# Patient Record
Sex: Female | Born: 1985 | Race: White | Hispanic: No | Marital: Single | Smoking: Former smoker
Health system: Southern US, Community
[De-identification: ages and names within clinical notes are randomized; demographics above are authoritative.]

## PROBLEM LIST (undated history)

## (undated) DIAGNOSIS — F32A Depression, unspecified: Secondary | ICD-10-CM

## (undated) DIAGNOSIS — M545 Low back pain, unspecified: Secondary | ICD-10-CM

## (undated) DIAGNOSIS — K219 Gastro-esophageal reflux disease without esophagitis: Secondary | ICD-10-CM

## (undated) DIAGNOSIS — D649 Anemia, unspecified: Secondary | ICD-10-CM

## (undated) DIAGNOSIS — G8929 Other chronic pain: Secondary | ICD-10-CM

## (undated) DIAGNOSIS — F419 Anxiety disorder, unspecified: Secondary | ICD-10-CM

## (undated) HISTORY — PX: TONSILLECTOMY: SUR1361

## (undated) HISTORY — PX: TUBAL LIGATION: SHX77

---
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
## 2005-12-22 ENCOUNTER — Ambulatory Visit: Payer: Self-pay | Admitting: Otolaryngology

## 2006-08-19 ENCOUNTER — Ambulatory Visit: Payer: Self-pay | Admitting: Unknown Physician Specialty

## 2009-02-11 ENCOUNTER — Emergency Department: Payer: Self-pay | Admitting: Emergency Medicine

## 2009-02-18 ENCOUNTER — Ambulatory Visit: Payer: Self-pay | Admitting: Unknown Physician Specialty

## 2009-09-09 ENCOUNTER — Observation Stay: Payer: Self-pay

## 2009-10-29 ENCOUNTER — Observation Stay: Payer: Self-pay

## 2009-11-07 ENCOUNTER — Inpatient Hospital Stay: Payer: Self-pay | Admitting: Obstetrics & Gynecology

## 2010-07-26 HISTORY — PX: DILATION AND CURETTAGE OF UTERUS: SHX78

## 2012-06-09 LAB — HM PAP SMEAR: HM PAP: NEGATIVE

## 2012-11-20 ENCOUNTER — Emergency Department: Payer: Self-pay | Admitting: Emergency Medicine

## 2012-11-20 LAB — CBC
HCT: 38.5 % (ref 35.0–47.0)
HGB: 12.7 g/dL (ref 12.0–16.0)
MCH: 31 pg (ref 26.0–34.0)
MCHC: 32.9 g/dL (ref 32.0–36.0)
MCV: 94 fL (ref 80–100)
Platelet: 183 10*3/uL (ref 150–440)
RBC: 4.08 10*6/uL (ref 3.80–5.20)
RDW: 12.7 % (ref 11.5–14.5)
WBC: 12.1 10*3/uL — ABNORMAL HIGH (ref 3.6–11.0)

## 2012-11-20 LAB — URINALYSIS, COMPLETE
Bilirubin,UR: NEGATIVE
Glucose,UR: NEGATIVE mg/dL (ref 0–75)
Leukocyte Esterase: NEGATIVE
Nitrite: NEGATIVE
Ph: 6 (ref 4.5–8.0)
Protein: NEGATIVE
RBC,UR: 1 /HPF (ref 0–5)
Specific Gravity: 1.003 (ref 1.003–1.030)
Squamous Epithelial: <1
WBC UR: <1 /HPF (ref 0–5)

## 2012-11-20 LAB — WET PREP, GENITAL

## 2012-11-20 LAB — HCG, QUANTITATIVE, PREGNANCY: Beta Hcg, Quant.: 7309 m[IU]/mL — ABNORMAL HIGH

## 2013-02-10 ENCOUNTER — Inpatient Hospital Stay: Payer: Self-pay | Admitting: Psychiatry

## 2013-02-10 LAB — CBC
HCT: 39.6 % (ref 35.0–47.0)
HGB: 13.2 g/dL (ref 12.0–16.0)
MCH: 31.2 pg (ref 26.0–34.0)
MCHC: 33.3 g/dL (ref 32.0–36.0)
MCV: 94 fL (ref 80–100)
Platelet: 189 10*3/uL (ref 150–440)
RBC: 4.23 10*6/uL (ref 3.80–5.20)
RDW: 13.7 % (ref 11.5–14.5)
WBC: 8.2 10*3/uL (ref 3.6–11.0)

## 2013-02-10 LAB — DRUG SCREEN, URINE

## 2013-02-10 LAB — COMPREHENSIVE METABOLIC PANEL
Albumin: 4 g/dL (ref 3.4–5.0)
Alkaline Phosphatase: 73 U/L (ref 50–136)
Anion Gap: 7 (ref 7–16)
BUN: 7 mg/dL (ref 7–18)
Bilirubin,Total: 0.3 mg/dL (ref 0.2–1.0)
Calcium, Total: 8.5 mg/dL (ref 8.5–10.1)
Chloride: 107 mmol/L (ref 98–107)
Co2: 24 mmol/L (ref 21–32)
Creatinine: 0.69 mg/dL (ref 0.60–1.30)
EGFR (African American): >60
EGFR (Non-African Amer.): >60
Glucose: 105 mg/dL — ABNORMAL HIGH (ref 65–99)
Osmolality: 274 (ref 275–301)
Potassium: 3.4 mmol/L — ABNORMAL LOW (ref 3.5–5.1)
SGOT(AST): 17 U/L (ref 15–37)
SGPT (ALT): 16 U/L (ref 12–78)
Sodium: 138 mmol/L (ref 136–145)
Total Protein: 7.9 g/dL (ref 6.4–8.2)

## 2013-02-10 LAB — URINALYSIS, COMPLETE
Bacteria: NONE SEEN
Bilirubin,UR: NEGATIVE
Blood: NEGATIVE
Glucose,UR: NEGATIVE mg/dL (ref 0–75)
Ketone: NEGATIVE
Leukocyte Esterase: NEGATIVE
Nitrite: NEGATIVE
Ph: 6 (ref 4.5–8.0)
Protein: NEGATIVE
RBC,UR: NONE SEEN /HPF (ref 0–5)
Specific Gravity: 1.002 (ref 1.003–1.030)
Squamous Epithelial: NONE SEEN
WBC UR: <1 /HPF (ref 0–5)

## 2013-02-10 LAB — ETHANOL
Ethanol %: 0.209 % — ABNORMAL HIGH (ref 0.000–0.080)
Ethanol: 209 mg/dL

## 2013-02-10 LAB — TSH: Thyroid Stimulating Horm: 2.87 u[IU]/mL

## 2013-02-11 LAB — BEHAVIORAL MEDICINE 1 PANEL
Albumin: 3.7 g/dL (ref 3.4–5.0)
Basophil %: 1.3 %
Bilirubin,Total: 1 mg/dL (ref 0.2–1.0)
Chloride: 106 mmol/L (ref 98–107)
Creatinine: 0.66 mg/dL (ref 0.60–1.30)
EGFR (African American): >60
Eosinophil #: 0.1 10*3/uL (ref 0.0–0.7)
Glucose: 84 mg/dL (ref 65–99)
HCT: 37 % (ref 35.0–47.0)
Lymphocyte #: 2.2 10*3/uL (ref 1.0–3.6)
Lymphocyte %: 26.2 %
MCHC: 33.6 g/dL (ref 32.0–36.0)
Monocyte %: 13.8 %
Neutrophil #: 4.9 10*3/uL (ref 1.4–6.5)
Potassium: 3.6 mmol/L (ref 3.5–5.1)
RDW: 13.8 % (ref 11.5–14.5)
Sodium: 139 mmol/L (ref 136–145)
Thyroid Stimulating Horm: 0.97 u[IU]/mL
Total Protein: 7.1 g/dL (ref 6.4–8.2)
WBC: 8.6 10*3/uL (ref 3.6–11.0)

## 2013-02-11 LAB — URINALYSIS, COMPLETE
Leukocyte Esterase: NEGATIVE
Ph: 7 (ref 4.5–8.0)
Protein: NEGATIVE
RBC,UR: <1 /HPF (ref 0–5)
Specific Gravity: 1.015 (ref 1.003–1.030)

## 2013-11-18 ENCOUNTER — Observation Stay: Payer: Self-pay | Admitting: Obstetrics and Gynecology

## 2013-11-18 LAB — URINALYSIS, COMPLETE
BLOOD: NEGATIVE
Bilirubin,UR: NEGATIVE
Glucose,UR: NEGATIVE mg/dL (ref 0–75)
KETONE: NEGATIVE
Leukocyte Esterase: NEGATIVE
Nitrite: NEGATIVE
PH: 6 (ref 4.5–8.0)
PROTEIN: NEGATIVE
RBC,UR: NONE SEEN /HPF (ref 0–5)
Specific Gravity: 1.015 (ref 1.003–1.030)
WBC UR: 1 /HPF (ref 0–5)

## 2013-11-18 LAB — CBC WITH DIFFERENTIAL/PLATELET
BASOS ABS: 0.1 10*3/uL (ref 0.0–0.1)
Basophil %: 0.8 %
EOS ABS: 0.2 10*3/uL (ref 0.0–0.7)
Eosinophil %: 1.2 %
HCT: 31.8 % — ABNORMAL LOW (ref 35.0–47.0)
HGB: 10.6 g/dL — AB (ref 12.0–16.0)
LYMPHS PCT: 14.9 %
Lymphocyte #: 2.1 10*3/uL (ref 1.0–3.6)
MCH: 30.4 pg (ref 26.0–34.0)
MCHC: 33.3 g/dL (ref 32.0–36.0)
MCV: 91 fL (ref 80–100)
MONOS PCT: 8.8 %
Monocyte #: 1.2 x10 3/mm — ABNORMAL HIGH (ref 0.2–0.9)
Neutrophil #: 10.3 10*3/uL — ABNORMAL HIGH (ref 1.4–6.5)
Neutrophil %: 74.3 %
PLATELETS: 234 10*3/uL (ref 150–440)
RBC: 3.48 10*6/uL — ABNORMAL LOW (ref 3.80–5.20)
RDW: 13.2 % (ref 11.5–14.5)
WBC: 13.9 10*3/uL — ABNORMAL HIGH (ref 3.6–11.0)

## 2013-11-18 LAB — COMPREHENSIVE METABOLIC PANEL
ALBUMIN: 2.5 g/dL — AB (ref 3.4–5.0)
ANION GAP: 7 (ref 7–16)
Alkaline Phosphatase: 97 U/L
BUN: 8 mg/dL (ref 7–18)
Bilirubin,Total: 0.1 mg/dL — ABNORMAL LOW (ref 0.2–1.0)
CALCIUM: 8.5 mg/dL (ref 8.5–10.1)
CO2: 21 mmol/L (ref 21–32)
CREATININE: 0.55 mg/dL — AB (ref 0.60–1.30)
Chloride: 107 mmol/L (ref 98–107)
EGFR (African American): >60
Glucose: 88 mg/dL (ref 65–99)
Osmolality: 268 (ref 275–301)
POTASSIUM: 3.5 mmol/L (ref 3.5–5.1)
SGOT(AST): 15 U/L (ref 15–37)
SGPT (ALT): 11 U/L — ABNORMAL LOW (ref 12–78)
SODIUM: 135 mmol/L — AB (ref 136–145)
Total Protein: 6.4 g/dL (ref 6.4–8.2)

## 2014-01-03 ENCOUNTER — Ambulatory Visit: Payer: Self-pay | Admitting: Obstetrics & Gynecology

## 2014-01-03 LAB — CBC WITH DIFFERENTIAL/PLATELET
BASOS ABS: 0.1 10*3/uL (ref 0.0–0.1)
BASOS PCT: 0.6 %
EOS PCT: 0.5 %
Eosinophil #: 0.1 10*3/uL (ref 0.0–0.7)
HCT: 32.9 % — AB (ref 35.0–47.0)
HGB: 11 g/dL — ABNORMAL LOW (ref 12.0–16.0)
Lymphocyte #: 1.7 10*3/uL (ref 1.0–3.6)
Lymphocyte %: 13.6 %
MCH: 30.5 pg (ref 26.0–34.0)
MCHC: 33.5 g/dL (ref 32.0–36.0)
MCV: 91 fL (ref 80–100)
Monocyte #: 1.1 x10 3/mm — ABNORMAL HIGH (ref 0.2–0.9)
Monocyte %: 9.1 %
Neutrophil #: 9.5 10*3/uL — ABNORMAL HIGH (ref 1.4–6.5)
Neutrophil %: 76.2 %
Platelet: 145 10*3/uL — ABNORMAL LOW (ref 150–440)
RBC: 3.61 10*6/uL — AB (ref 3.80–5.20)
RDW: 14.6 % — AB (ref 11.5–14.5)
WBC: 12.5 10*3/uL — ABNORMAL HIGH (ref 3.6–11.0)

## 2014-01-04 ENCOUNTER — Inpatient Hospital Stay: Payer: Self-pay | Admitting: Obstetrics and Gynecology

## 2014-01-05 LAB — CBC WITH DIFFERENTIAL/PLATELET
BASOS ABS: 0.1 10*3/uL (ref 0.0–0.1)
BASOS PCT: 0.6 %
Eosinophil #: 0.1 10*3/uL (ref 0.0–0.7)
Eosinophil %: 0.7 %
HCT: 30.3 % — ABNORMAL LOW (ref 35.0–47.0)
HGB: 10.2 g/dL — AB (ref 12.0–16.0)
LYMPHS ABS: 2 10*3/uL (ref 1.0–3.6)
LYMPHS PCT: 12.1 %
MCH: 30.8 pg (ref 26.0–34.0)
MCHC: 33.8 g/dL (ref 32.0–36.0)
MCV: 91 fL (ref 80–100)
Monocyte #: 1.8 x10 3/mm — ABNORMAL HIGH (ref 0.2–0.9)
Monocyte %: 10.8 %
NEUTROS ABS: 12.6 10*3/uL — AB (ref 1.4–6.5)
NEUTROS PCT: 75.8 %
Platelet: 136 10*3/uL — ABNORMAL LOW (ref 150–440)
RBC: 3.32 10*6/uL — ABNORMAL LOW (ref 3.80–5.20)
RDW: 14.8 % — AB (ref 11.5–14.5)
WBC: 16.6 10*3/uL — ABNORMAL HIGH (ref 3.6–11.0)
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
## 2015-02-16 NOTE — Op Note (Signed)
PATIENT NAME:  Denise Houston, [name] MR#:  [id number] DATE OF BIRTH:  [birth year]  DATE OF PROCEDURE:  01/04/2014  PREOPERATIVE DIAGNOSIS: Term intrauterine pregnancy and prior history of cesarean section.   POSTOPERATIVE DIAGNOSIS: Term intrauterine pregnancy and prior history of cesarean section.  PROCEDURE: Low transverse cesarean section and placement of On-Q pain pump.   SURGEON: Annamarie MajorPaul Madailein Londo, M.D.   ASSISTANT: Dr. Bonney AidStaebler.   ANESTHESIA: Spinal.   ESTIMATED BLOOD LOSS: 250 mL.   COMPLICATIONS: None.   FINDINGS: Normal tubes, ovaries, and uterus. Viable female weighing 8 pounds, 15 ounces with Apgar scores of 9 and 9 at one and five minutes, respectively.   DISPOSITION: To the recovery room in stable condition.   TECHNIQUE: The patient is prepped and draped in the usual sterile fashion after adequate anesthesia is obtained in the supine position on the operating room table. Scalpel is used to create a low transverse skin incision in the area of the prior scar down to the level of the rectus fascia. The rectus fascia was then dissected bilaterally using Mayo scissors. The rectus muscles are separated from the fascia and then separated in the midline. The peritoneum is penetrated and the bladder is inferiorly retracted.   A scalpel was used to create a low transverse hysterotomy incision that is then extended by blunt dissection. Amniotomy then reveals clear fluid. The head is delivered with suctioning of the oropharynx and no nuchal cord is noted. No vacuum device is used. The remaining portion of the infant is delivered and handed to the pediatric team.   Cord blood is obtained and the placenta is manually extracted. The uterus is externalized and cleansed of all membranes and debris using a moist sponge. The hysterotomy incision is closed with a running #1 Vicryl suture in a locking fashion followed by a second layer to imbricate the first layer with excellent hemostasis noted. The uterus is  placed back in the intra-abdominal cavity and the paracolic gutters are irrigated with warm saline. Re-examination of the incision reveals excellent hemostasis and Interceed was placed over the incision to minimize adhesion formation.   The peritoneum is closed with 1 Vicryl suture. The On-Q pain pump catheters are then placed. First, trocars were placed through the abdomen into the subfascial space and then the catheters are threaded into place in the subfascial space. The fascia is then closed with a 0 Maxon suture with careful placement not to incorporate these catheters. Subcutaneous tissues are irrigated and hemostasis is assured using electrocautery. Skin is then closed with 4-0 Vicryl suture in a subcuticular fashion followed by placement of Dermabond. The catheters were flushed with 5 mL each of bupivacaine and then stabilized in place with a Tegaderm bandage. The patient goes to the recovery room in stable condition. All sponge, instrument, and needle counts are correct.   ____________________________ R. Annamarie MajorPaul Davan Nawabi, MD rph:aw D: 01/04/2014 08:35:05 ET T: 01/04/2014 08:43:15 ET JOB#: [id number]  cc: Dierdre Searles. Paul Seniyah Esker, MD, <Dictator> Nadara MustardOBERT P Aeriana Speece MD ELECTRONICALLY SIGNED 01/04/2014 20:19

## 2015-03-05 NOTE — H&P (Signed)
L&D Evaluation:  History Expanded:  HPI 29 yo G4P1021 at 2132w5days, who has had previous csection and presents with ctx and some back pain  desires repeat. late entry addition   Gravida 4   Term 1   PreTerm 0   Abortion 2   Living 1   Blood Type (Maternal) O positive   Group B Strep Results Maternal (Result >5wks must be treated as unknown) unknown/result > 5 weeks ago   Maternal HIV Negative   Maternal Syphilis Ab Nonreactive   Maternal Varicella Immune   Rubella Results (Maternal) immune   EDC 09-Jan-2014   Presents with contractions   Patient's Medical History No Chronic Illness   Patient's Surgical History Previous C-Section   Medications Pre Natal Vitamins   Allergies NKDA   Social History none   Family History Non-Contributory   ROS:  ROS All systems were reviewed.  HEENT, CNS, GI, GU, Respiratory, CV, Renal and Musculoskeletal systems were found to be normal.   Exam:  Vital Signs stable   Urine Protein negative dipstick   General no apparent distress   Mental Status clear   Chest clear   Heart normal sinus rhythm   Abdomen gravid, non-tender   Estimated Fetal Weight Average for gestational age, long and closed   Back no CVAT   Edema no edema   Reflexes 1+   Clonus positive   Pelvic no external lesions   Mebranes Intact   FHT normal rate with no decels, cat 1   Ucx absent   Skin dry   Impression:  Impression preterm contractions   Plan:  Plan monitor contractions and for cervical change   Comments give terb x 1 has stopped her   Follow Up Appointment in 1 week   Electronic Signatures: Adria DevonKlett, Kinslie Hove (MD)  (Signed 27-Jan-15 20:00)  Authored: L&D Evaluation   Last Updated: 27-Jan-15 20:00 by Adria DevonKlett, Nareg Breighner (MD)

## 2016-01-17 ENCOUNTER — Encounter: Payer: Self-pay | Admitting: Obstetrics & Gynecology

## 2016-05-09 ENCOUNTER — Emergency Department: Payer: 59

## 2016-05-09 ENCOUNTER — Emergency Department
Admission: EM | Admit: 2016-05-09 | Discharge: 2016-05-09 | Disposition: A | Discharge status: Home / Self Care | Payer: 59 | Attending: Emergency Medicine | Admitting: Emergency Medicine

## 2016-05-09 ENCOUNTER — Encounter: Payer: Self-pay | Admitting: Emergency Medicine

## 2016-05-09 DIAGNOSIS — S161XXA Strain of muscle, fascia and tendon at neck level, initial encounter: Secondary | ICD-10-CM | POA: Diagnosis not present

## 2016-05-09 DIAGNOSIS — Y999 Unspecified external cause status: Secondary | ICD-10-CM | POA: Diagnosis not present

## 2016-05-09 DIAGNOSIS — Y929 Unspecified place or not applicable: Secondary | ICD-10-CM | POA: Diagnosis not present

## 2016-05-09 DIAGNOSIS — Y939 Activity, unspecified: Secondary | ICD-10-CM | POA: Diagnosis not present

## 2016-05-09 DIAGNOSIS — F17200 Nicotine dependence, unspecified, uncomplicated: Secondary | ICD-10-CM | POA: Insufficient documentation

## 2016-05-09 DIAGNOSIS — M542 Cervicalgia: Secondary | ICD-10-CM | POA: Diagnosis present

## 2016-05-09 DIAGNOSIS — S40211A Abrasion of right shoulder, initial encounter: Secondary | ICD-10-CM | POA: Insufficient documentation

## 2016-05-09 DIAGNOSIS — S300XXA Contusion of lower back and pelvis, initial encounter: Secondary | ICD-10-CM | POA: Insufficient documentation

## 2016-05-09 LAB — POCT PREGNANCY, URINE: PREG TEST UR: NEGATIVE

## 2016-05-09 MED ORDER — HYDROCODONE-ACETAMINOPHEN 5-325 MG PO TABS
1.0000 | ORAL_TABLET | Freq: Once | ORAL | Status: AC
  Administered 2016-05-09: 1 via ORAL
  Filled 2016-05-09: qty 1

## 2016-05-09 MED ORDER — IBUPROFEN 800 MG PO TABS: 800.0000 mg | ORAL_TABLET | Freq: Three times a day (TID) | ORAL | Status: DC | PRN

## 2016-05-09 MED ORDER — CYCLOBENZAPRINE HCL 10 MG PO TABS: 10.0000 mg | ORAL_TABLET | Freq: Three times a day (TID) | ORAL | Status: DC | PRN
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
## 2016-05-09 NOTE — Discharge Instructions (Signed)
Cervical Sprain °A cervical sprain is an injury in the neck in which the strong, fibrous tissues (ligaments) that connect your neck bones stretch or tear. Cervical sprains can range from mild to severe. Severe cervical sprains can cause the neck vertebrae to be unstable. This can lead to damage of the spinal cord and can result in serious nervous system problems. The amount of time it takes for a cervical sprain to get better depends on the cause and extent of the injury. Most cervical sprains heal in 1 to 3 weeks. °CAUSES  °Severe cervical sprains may be caused by:  °· Contact sport injuries (such as from football, rugby, wrestling, hockey, auto racing, gymnastics, diving, martial arts, or boxing).   °· Motor vehicle collisions.   °· Whiplash injuries. This is an injury from a sudden forward and backward whipping movement of the head and neck.  °· Falls.   °Mild cervical sprains may be caused by:  °· Being in an awkward position, such as while cradling a telephone between your ear and shoulder.   °· Sitting in a chair that does not offer proper support.   °· Working at a poorly designed computer station.   °· Looking up or down for long periods of time.   °SYMPTOMS  °· Pain, soreness, stiffness, or a burning sensation in the front, back, or sides of the neck. This discomfort may develop immediately after the injury or slowly, 24 hours or more after the injury.   °· Pain or tenderness directly in the middle of the back of the neck.   °· Shoulder or upper back pain.   °· Limited ability to move the neck.   °· Headache.   °· Dizziness.   °· Weakness, numbness, or tingling in the hands or arms.   °· Muscle spasms.   °· Difficulty swallowing or chewing.   °· Tenderness and swelling of the neck.   °DIAGNOSIS  °Most of the time your health care provider can diagnose a cervical sprain by taking your history and doing a physical exam. Your health care provider will ask about previous neck injuries and any known neck  problems, such as arthritis in the neck. X-rays may be taken to find out if there are any other problems, such as with the bones of the neck. Other tests, such as a CT scan or MRI, may also be needed.  °TREATMENT  °Treatment depends on the severity of the cervical sprain. Mild sprains can be treated with rest, keeping the neck in place (immobilization), and pain medicines. Severe cervical sprains are immediately immobilized. Further treatment is done to help with pain, muscle spasms, and other symptoms and may include: °· Medicines, such as pain relievers, numbing medicines, or muscle relaxants.   °· Physical therapy. This may involve stretching exercises, strengthening exercises, and posture training. Exercises and improved posture can help stabilize the neck, strengthen muscles, and help stop symptoms from returning.   °HOME CARE INSTRUCTIONS  °· Put ice on the injured area.   °¨ Put ice in a plastic bag.   °¨ Place a towel between your skin and the bag.   °¨ Leave the ice on for 15-20 minutes, 3-4 times a day.   °· If your injury was severe, you may have been given a cervical collar to wear. A cervical collar is a two-piece collar designed to keep your neck from moving while it heals. °¨ Do not remove the collar unless instructed by your health care provider. °¨ If you have long hair, keep it outside of the collar. °¨ Ask your health care provider before making any adjustments to your collar. Minor   adjustments may be required over time to improve comfort and reduce pressure on your chin or on the back of your head.  Ifyou are allowed to remove the collar for cleaning or bathing, follow your health care provider's instructions on how to do so safely.  Keep your collar clean by wiping it with mild soap and water and drying it completely. If the collar you have been given includes removable pads, remove them every 1-2 days and hand wash them with soap and water. Allow them to air dry. They should be completely  dry before you wear them in the collar.  If you are allowed to remove the collar for cleaning and bathing, wash and dry the skin of your neck. Check your skin for irritation or sores. If you see any, tell your health care provider.  Do not drive while wearing the collar.   Only take over-the-counter or prescription medicines for pain, discomfort, or fever as directed by your health care provider.   Keep all follow-up appointments as directed by your health care provider.   Keep all physical therapy appointments as directed by your health care provider.   Make any needed adjustments to your workstation to promote good posture.   Avoid positions and activities that make your symptoms worse.   Warm up and stretch before being active to help prevent problems.  SEEK MEDICAL CARE IF:   Your pain is not controlled with medicine.   You are unable to decrease your pain medicine over time as planned.   Your activity level is not improving as expected.  SEEK IMMEDIATE MEDICAL CARE IF:   You develop any bleeding.  You develop stomach upset.  You have signs of an allergic reaction to your medicine.   Your symptoms get worse.   You develop new, unexplained symptoms.   You have numbness, tingling, weakness, or paralysis in any part of your body.  MAKE SURE YOU:   Understand these instructions.  Will watch your condition.  Will get help right away if you are not doing well or get worse.   This information is not intended to replace advice given to you by your health care provider. Make sure you discuss any questions you have with your health care provider.   Document Released: 08/09/2007 Document Revised: 10/17/2013 Document Reviewed: 04/19/2013 Elsevier Interactive Patient Education 2016 [employer]  Tailbone Injury The tailbone (coccyx) is the small bone at the lower end of the spine. A tailbone injury may involve stretched ligaments, bruising, or a broken bone  (fracture). Tailbone injuries can be painful, and some may take a long time to heal. CAUSES This condition is often caused by falling and landing on the tailbone. Other causes include:  Repeated strain or friction from actions such as rowing and bicycling.  Childbirth. In some cases, the cause may not be known. RISK FACTORS This condition is more common in women than in men. SYMPTOMS Symptoms of this condition include:  Pain in the lower back, especially when sitting.  Pain or difficulty when standing up from a sitting position.  Bruising in the tailbone area.  Painful bowel movements.  In women, pain during intercourse. DIAGNOSIS This condition may be diagnosed based on your symptoms and a physical exam. X-rays may be taken if a fracture is suspected. You may also have other tests, such as a CT scan or MRI. TREATMENT This condition may be treated with medicines to help relieve your pain. Most tailbone injuries heal on their own in  4-6 weeks. However, recovery time may be longer if the injury involves a fracture. HOME CARE INSTRUCTIONS  Take medicines only as directed by your health care provider.  If directed, apply ice to the injured area:  Put ice in a plastic bag.  Place a towel between your skin and the bag.  Leave the ice on for 20 minutes, 2-3 times per day for the first 1-2 days.  Sit on a large, rubber or inflated ring or cushion to ease your pain. Lean forward when you are sitting to help decrease discomfort.  Avoid sitting for long periods of time.  Increase your activity as the pain allows. Perform any exercises that are recommended by your health care provider or physical therapist.  If you have pain during bowel movements, use stool softeners as directed by your health care provider.  Eat a diet that includes plenty of fiber to help prevent constipation.  Keep all follow-up visits as directed by your health care provider. This is  important. PREVENTION Wear appropriate padding and sports gear when bicycling and rowing. This can help to prevent developing an injury that is caused by repeated strain or friction. SEEK MEDICAL CARE IF:  Your pain becomes worse.  Your bowel movements cause a great deal of discomfort.  You are unable to have a bowel movement.  You have uncontrolled urine loss (urinary incontinence).  You have a fever.   This information is not intended to replace advice given to you by your health care provider. Make sure you discuss any questions you have with your health care provider.   Document Released: 10/09/2000 Document Revised: 02/26/2015 Document Reviewed: 10/08/2014 Elsevier Interactive Patient Education [employer]
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
## 2017-03-08 ENCOUNTER — Ambulatory Visit: Payer: 59

## 2017-03-08 ENCOUNTER — Encounter (INDEPENDENT_AMBULATORY_CARE_PROVIDER_SITE_OTHER): Payer: 59 | Admitting: Podiatry

## 2017-03-08 DIAGNOSIS — M79674 Pain in right toe(s): Secondary | ICD-10-CM

## 2017-03-08 DIAGNOSIS — M79675 Pain in left toe(s): Secondary | ICD-10-CM

## 2017-03-08 NOTE — Progress Notes (Signed)
This encounter was created in error - please disregard.

## 2017-03-10 ENCOUNTER — Ambulatory Visit (INDEPENDENT_AMBULATORY_CARE_PROVIDER_SITE_OTHER): Payer: 59 | Admitting: Podiatry

## 2017-03-10 ENCOUNTER — Encounter: Payer: Self-pay | Admitting: Podiatry

## 2017-03-10 ENCOUNTER — Ambulatory Visit: Payer: 59

## 2017-03-10 DIAGNOSIS — M79671 Pain in right foot: Secondary | ICD-10-CM

## 2017-03-10 DIAGNOSIS — M21629 Bunionette of unspecified foot: Secondary | ICD-10-CM | POA: Diagnosis not present

## 2017-03-10 DIAGNOSIS — M2041 Other hammer toe(s) (acquired), right foot: Secondary | ICD-10-CM | POA: Diagnosis not present

## 2017-03-10 DIAGNOSIS — M2042 Other hammer toe(s) (acquired), left foot: Secondary | ICD-10-CM | POA: Diagnosis not present

## 2017-03-10 DIAGNOSIS — M79672 Pain in left foot: Principal | ICD-10-CM

## 2017-03-10 NOTE — Progress Notes (Signed)
She presents today with chief complaint of pain and burning to the fourth and fifth digits of the bilateral foot and and not to the metatarsophalangeal joint fifth bilateral. She states that she is [redacted] weeks pregnant would like to have the surgery correct these toes prior to being pregnant. She states that she had surgery to the fourth toe bilaterally many years ago.  Objective: Vital signs are stable alert oriented 3 have reviewed her past medical history medications allergies and social history. Pulses are palpable neurological sensorium is intact deep tendon reflexes are intact muscle strength +5 over 5 dorsiflexion plantar flexors and inverters everters all his musculatures intact. Orthopedic evaluation of his result is disabling for range of motion and crepitus toes bunion deformities are present bilaterally. Varus rotated hammertoe deformity fifth bilateral and a flexible contracted hammertoe deformity with dorsal erythema and reactive hyperkeratosis at the level of the PIPJ.  Assessment: Hammertoe deformities 10 but informed his bilateral fourth and fifth.  Plan: Recommend that she follow-up with us on an as-needed basis once her baby is born for surgical intervention.

## 2017-03-19 ENCOUNTER — Ambulatory Visit (INDEPENDENT_AMBULATORY_CARE_PROVIDER_SITE_OTHER): Payer: 59 | Admitting: Obstetrics & Gynecology

## 2017-03-19 ENCOUNTER — Encounter: Payer: Self-pay | Admitting: Obstetrics & Gynecology

## 2017-03-19 VITALS — BP 110/60 | Wt 152.0 lb

## 2017-03-19 DIAGNOSIS — Z3490 Encounter for supervision of normal pregnancy, unspecified, unspecified trimester: Secondary | ICD-10-CM

## 2017-03-19 DIAGNOSIS — Z98891 History of uterine scar from previous surgery: Secondary | ICD-10-CM

## 2017-03-19 DIAGNOSIS — Z3A09 9 weeks gestation of pregnancy: Secondary | ICD-10-CM | POA: Diagnosis not present

## 2017-03-19 DIAGNOSIS — Z3201 Encounter for pregnancy test, result positive: Secondary | ICD-10-CM

## 2017-03-19 DIAGNOSIS — O0990 Supervision of high risk pregnancy, unspecified, unspecified trimester: Secondary | ICD-10-CM | POA: Insufficient documentation

## 2017-03-19 LAB — POCT URINE PREGNANCY: PREG TEST UR: POSITIVE — AB

## 2017-03-19 MED ORDER — DOXYLAMINE-PYRIDOXINE 10-10 MG PO TBEC: 2.0000 | DELAYED_RELEASE_TABLET | Freq: Every day | ORAL | 5 refills | Status: DC

## 2017-03-19 NOTE — Addendum Note (Signed)
Addended by: Cornelius MorasPATTERSON, Dilara Navarrete D on: 03/19/2017 10:33 AM   Modules accepted: Orders

## 2017-03-19 NOTE — Progress Notes (Signed)
03/19/2017   Chief Complaint: Missed period  Transfer of Care Patient: no  History of Present Illness: [name] is a 31 y.o. G1P0 [redacted]w[redacted]d based on Patient's last menstrual period was 01/12/2017. with an Estimated Date of Delivery: 10/19/17, with the above CC. Preg complicated by has Encounter for supervision of low-risk pregnancy, antepartum and History of cesarean delivery on her problem list..  Her periods were: irregular periods from 28 to 36 days She was using no method when she conceived.  She has Positive signs or symptoms of nausea/vomiting of pregnancy. She has Negative signs or symptoms of miscarriage or preterm labor She identifies Negative Zika risk factors for her and her partner On any different medications around the time she conceived/early pregnancy: No  History of varicella: Yes   ROS: A 12-point review of systems was performed and negative, except as stated in the above HPI.  OBGYN History: As per HPI. OB History  Gravida Para Term Preterm AB Living  1            SAB TAB Ectopic Multiple Live Births               # Outcome Date GA Lbr Len/2nd Weight Sex Delivery Anes PTL Lv  1 Current               Any issues with any prior pregnancies: CS x2 Any prior children are healthy, doing well, without any problems or issues: no History of pap smears: No. Last pap smear 2015. Abnormal: no  History of STIs: No   Past Medical History: No past medical history on file.  Past Surgical History: No past surgical history on file.  Family History:  No family history on file. She denies any female cancers, bleeding or blood clotting disorders.  She denies any history of mental retardation, birth defects or genetic disorders in her or the FOB's history  Social History:  Social History   Social History  . Marital status: Single    Spouse name: N/A  . Number of children: N/A  . Years of education: N/A   Occupational History  . Not on file.   Social History Main  Topics  . Smoking status: Current Every Day Smoker  . Smokeless tobacco: Not on file  . Alcohol use No  . Drug use: Unknown  . Sexual activity: Not on file   Other Topics Concern  . Not on file   Social History Narrative  . No narrative on file   Any pets in the household: no  Allergy: No Known Allergies  Current Outpatient Medications:  Current Outpatient Prescriptions:  .  Prenatal Vit-Fe Fumarate-FA (MULTIVITAMIN-PRENATAL) 27-0.8 MG TABS tablet, Take 1 tablet by mouth daily at 12 noon., Disp: , Rfl:  .  Doxylamine-Pyridoxine (DICLEGIS) 10-10 MG TBEC, Take 2 tablets by mouth at bedtime. If symptoms persist, add one tablet in the morning and one in the afternoon, Disp: 100 tablet, Rfl: 5 .  ibuprofen (ADVIL,MOTRIN) 800 MG tablet, Take 1 tablet (800 mg total) by mouth every 8 (eight) hours as needed. (Patient not taking: Reported on 03/19/2017), Disp: 30 tablet, Rfl: 0   Physical Exam:   BP 110/60   Wt 152 lb (68.9 kg)   LMP 01/12/2017 Comment: neg preg test  BMI 23.81 kg/m  Body mass index is 23.81 kg/m. Constitutional: Well nourished, well developed female in no acute distress.  Neck:  Supple, normal appearance, and no thyromegaly  Cardiovascular: S1, S2 normal, no murmur, rub or gallop,  regular rate and rhythm Respiratory:  Clear to auscultation bilateral. Normal respiratory effort Abdomen: positive bowel sounds and no masses, hernias; diffusely non tender to palpation, non distended Breasts: breasts appear normal, no suspicious masses, no skin or nipple changes or axillary nodes. Neuro/Psych:  Normal mood and affect.  Skin:  Warm and dry.  Lymphatic:  No inguinal lymphadenopathy.   Pelvic exam: is not limited by body habitus EGBUS: within normal limits, Vagina: within normal limits and with no blood in the vault, Cervix: normal appearing cervix without discharge or lesions, closed/long/high, Uterus:  enlarged: 10 weels, and Adnexa:  not evaluated  Assessment: Ms.  Denise Houston is a 31 y.o. G1P0 7114w3d based on Patient's last menstrual period was 01/12/2017. with an Estimated Date of Delivery: 10/19/17,  for prenatal care.  Plan:  No problem-specific Assessment & Plan notes found for this encounter.  1. [redacted] weeks gestation of pregnancy - IGP,CtNgTv,Apt HPV,rfx16/18,45 - Urine culture - Urine Drug Panel 7 - RPR+Rh+ABO+Rub Ab+Ab Scr+CB... - US OB Comp Less 14 Wks; Future  2. Encounter for supervision of low-risk pregnancy, antepartum - IGP,CtNgTv,Apt HPV,rfx16/18,45 - Urine culture - Urine Drug Panel 7 - RPR+Rh+ABO+Rub Ab+Ab Scr+CB... - US OB Comp Less 14 Wks; Future  3. History of cesarean delivery - IGP,CtNgTv,Apt HPV,rfx16/18,45 - Urine culture - Urine Drug Panel 7 - RPR+Rh+ABO+Rub Ab+Ab Scr+CB... - US OB Comp Less 14 Wks; Future   The patient has not traveled to a [place] endemic area within the past 6 months, nor has she had unprotected sex with a partner who has travelled to a [place] endemic region within the past 6 months. The patient has been advised to notify us if these factors change any time during this current pregnancy, so adequate testing and monitoring can be initiated.  Problem list reviewed and updated.  Follow up in 1 weeks. Annamarie MajorPaul Braeden Dolinski, MD, Merlinda FrederickFACOG [facility], [facility] 03/19/2017  10:21 AM

## 2017-03-19 NOTE — Patient Instructions (Signed)
First Trimester of Pregnancy The first trimester of pregnancy is from week 1 until the end of week 13 (months 1 through 3). A week after a sperm fertilizes an egg, the egg will implant on the wall of the uterus. This embryo will begin to develop into a baby. Genes from you and your partner will form the baby. The female genes will determine whether the baby will be a boy or a girl. At 6-8 weeks, the eyes and face will be formed, and the heartbeat can be seen on ultrasound. At the end of 12 weeks, all the baby's organs will be formed. Now that you are pregnant, you will want to do everything you can to have a healthy baby. Two of the most important things are to get good prenatal care and to follow your health care provider's instructions. Prenatal care is all the medical care you receive before the baby's birth. This care will help prevent, find, and treat any problems during the pregnancy and childbirth. Body changes during your first trimester Your body goes through many changes during pregnancy. The changes vary from woman to woman.  You may gain or lose a couple of pounds at first.  You may feel sick to your stomach (nauseous) and you may throw up (vomit). If the vomiting is uncontrollable, call your health care provider.  You may tire easily.  You may develop headaches that can be relieved by medicines. All medicines should be approved by your health care provider.  You may urinate more often. Painful urination may mean you have a bladder infection.  You may develop heartburn as a result of your pregnancy.  You may develop constipation because certain hormones are causing the muscles that push stool through your intestines to slow down.  You may develop hemorrhoids or swollen veins (varicose veins).  Your breasts may begin to grow larger and become tender. Your nipples may stick out more, and the tissue that surrounds them (areola) may become darker.  Your gums may bleed and may be  sensitive to brushing and flossing.  Dark spots or blotches (chloasma, mask of pregnancy) may develop on your face. This will likely fade after the baby is born.  Your menstrual periods will stop.  You may have a loss of appetite.  You may develop cravings for certain kinds of food.  You may have changes in your emotions from day to day, such as being excited to be pregnant or being concerned that something may go wrong with the pregnancy and baby.  You may have more vivid and strange dreams.  You may have changes in your hair. These can include thickening of your hair, rapid growth, and changes in texture. Some women also have hair loss during or after pregnancy, or hair that feels dry or thin. Your hair will most likely return to normal after your baby is born.  What to expect at prenatal visits During a routine prenatal visit:  You will be weighed to make sure you and the baby are growing normally.  Your blood pressure will be taken.  Your abdomen will be measured to track your baby's growth.  The fetal heartbeat will be listened to between weeks 10 and 14 of your pregnancy.  Test results from any previous visits will be discussed.  Your health care provider may ask you:  How you are feeling.  If you are feeling the baby move.  If you have had any abnormal symptoms, such as leaking fluid, bleeding, severe headaches,   or abdominal cramping.  If you are using any tobacco products, including cigarettes, chewing tobacco, and electronic cigarettes.  If you have any questions.  Other tests that may be performed during your first trimester include:  Blood tests to find your blood type and to check for the presence of any previous infections. The tests will also be used to check for low iron levels (anemia) and protein on red blood cells (Rh antibodies). Depending on your risk factors, or if you previously had diabetes during pregnancy, you may have tests to check for high blood  sugar that affects pregnant women (gestational diabetes).  Urine tests to check for infections, diabetes, or protein in the urine.  An ultrasound to confirm the proper growth and development of the baby.  Fetal screens for spinal cord problems (spina bifida) and Down syndrome.  HIV (human immunodeficiency virus) testing. Routine prenatal testing includes screening for HIV, unless you choose not to have this test.  You may need other tests to make sure you and the baby are doing well.  Follow these instructions at home: Medicines  Follow your health care provider's instructions regarding medicine use. Specific medicines may be either safe or unsafe to take during pregnancy.  Take a prenatal vitamin that contains at least 600 micrograms (mcg) of folic acid.  If you develop constipation, try taking a stool softener if your health care provider approves. Eating and drinking  Eat a balanced diet that includes fresh fruits and vegetables, whole grains, good sources of protein such as meat, eggs, or tofu, and low-fat dairy. Your health care provider will help you determine the amount of weight gain that is right for you.  Avoid raw meat and uncooked cheese. These carry germs that can cause birth defects in the baby.  Eating four or five small meals rather than three large meals a day may help relieve nausea and vomiting. If you start to feel nauseous, eating a few soda crackers can be helpful. Drinking liquids between meals, instead of during meals, also seems to help ease nausea and vomiting.  Limit foods that are high in fat and processed sugars, such as fried and sweet foods.  To prevent constipation: ? Eat foods that are high in fiber, such as fresh fruits and vegetables, whole grains, and beans. ? Drink enough fluid to keep your urine clear or pale yellow. Activity  Exercise only as directed by your health care provider. Most women can continue their usual exercise routine during  pregnancy. Try to exercise for 30 minutes at least 5 days a week. Exercising will help you: ? Control your weight. ? Stay in shape. ? Be prepared for labor and delivery.  Experiencing pain or cramping in the lower abdomen or lower back is a good sign that you should stop exercising. Check with your health care provider before continuing with normal exercises.  Try to avoid standing for long periods of time. Move your legs often if you must stand in one place for a long time.  Avoid heavy lifting.  Wear low-heeled shoes and practice good posture.  You may continue to have sex unless your health care provider tells you not to. Relieving pain and discomfort  Wear a good support bra to relieve breast tenderness.  Take warm sitz baths to soothe any pain or discomfort caused by hemorrhoids. Use hemorrhoid cream if your health care provider approves.  Rest with your legs elevated if you have leg cramps or low back pain.  If you develop   varicose veins in your legs, wear support hose. Elevate your feet for 15 minutes, 3-4 times a day. Limit salt in your diet. Prenatal care  Schedule your prenatal visits by the twelfth week of pregnancy. They are usually scheduled monthly at first, then more often in the last 2 months before delivery.  Write down your questions. Take them to your prenatal visits.  Keep all your prenatal visits as told by your health care provider. This is important. Safety  Wear your seat belt at all times when driving.  Make a list of emergency phone numbers, including numbers for family, friends, the hospital, and police and fire departments. General instructions  Ask your health care provider for a referral to a local prenatal education class. Begin classes no later than the beginning of month 6 of your pregnancy.  Ask for help if you have counseling or nutritional needs during pregnancy. Your health care provider can offer advice or refer you to specialists for help  with various needs.  Do not use hot tubs, steam rooms, or saunas.  Do not douche or use tampons or scented sanitary pads.  Do not cross your legs for long periods of time.  Avoid cat litter boxes and soil used by cats. These carry germs that can cause birth defects in the baby and possibly loss of the fetus by miscarriage or stillbirth.  Avoid all smoking, herbs, alcohol, and medicines not prescribed by your health care provider. Chemicals in these products affect the formation and growth of the baby.  Do not use any products that contain nicotine or tobacco, such as cigarettes and e-cigarettes. If you need help quitting, ask your health care provider. You may receive counseling support and other resources to help you quit.  Schedule a dentist appointment. At home, brush your teeth with a soft toothbrush and be gentle when you floss. Contact a health care provider if:  You have dizziness.  You have mild pelvic cramps, pelvic pressure, or nagging pain in the abdominal area.  You have persistent nausea, vomiting, or diarrhea.  You have a bad smelling vaginal discharge.  You have pain when you urinate.  You notice increased swelling in your face, hands, legs, or ankles.  You are exposed to fifth disease or chickenpox.  You are exposed to German measles (rubella) and have never had it. Get help right away if:  You have a fever.  You are leaking fluid from your vagina.  You have spotting or bleeding from your vagina.  You have severe abdominal cramping or pain.  You have rapid weight gain or loss.  You vomit blood or material that looks like coffee grounds.  You develop a severe headache.  You have shortness of breath.  You have any kind of trauma, such as from a fall or a car accident. Summary  The first trimester of pregnancy is from week 1 until the end of week 13 (months 1 through 3).  Your body goes through many changes during pregnancy. The changes vary from  woman to woman.  You will have routine prenatal visits. During those visits, your health care provider will examine you, discuss any test results you may have, and talk with you about how you are feeling. This information is not intended to replace advice given to you by your health care provider. Make sure you discuss any questions you have with your health care provider. Document Released: 10/06/2001 Document Revised: 09/23/2016 Document Reviewed: 09/23/2016 Elsevier Interactive Patient Education  2017 Elsevier   Inc.  

## 2017-03-20 LAB — URINE DRUG PANEL 7
AMPHETAMINES, URINE: NEGATIVE ng/mL
BARBITURATE QUANT UR: NEGATIVE ng/mL
BENZODIAZEPINE QUANT UR: NEGATIVE ng/mL
Cannabinoid Quant, Ur: NEGATIVE ng/mL
Cocaine (Metab.): NEGATIVE ng/mL
Opiate Quant, Ur: NEGATIVE ng/mL
PCP Quant, Ur: NEGATIVE ng/mL

## 2017-03-20 LAB — RPR+RH+ABO+RUB AB+AB SCR+CB...
ANTIBODY SCREEN: NEGATIVE
HEMATOCRIT: 36.3 % (ref 34.0–46.6)
HEMOGLOBIN: 12 g/dL (ref 11.1–15.9)
HIV Screen 4th Generation wRfx: NONREACTIVE
Hepatitis B Surface Ag: NEGATIVE
MCH: 30.4 pg (ref 26.6–33.0)
MCHC: 33.1 g/dL (ref 31.5–35.7)
MCV: 92 fL (ref 79–97)
Platelets: 219 10*3/uL (ref 150–379)
RBC: 3.95 x10E6/uL (ref 3.77–5.28)
RDW: 13.1 % (ref 12.3–15.4)
RH TYPE: POSITIVE
RPR Ser Ql: NONREACTIVE
RUBELLA: 1.68 {index} (ref >0.99)
VARICELLA: 727 {index} (ref >165)
WBC: 13.7 10*3/uL — AB (ref 3.4–10.8)

## 2017-03-21 LAB — URINE CULTURE: Organism ID, Bacteria: NO GROWTH

## 2017-03-23 ENCOUNTER — Telehealth: Payer: Self-pay

## 2017-03-23 NOTE — Telephone Encounter (Signed)
Called pt, voicemail box not set up. What medication does pt need ? Prenatal vitamins?

## 2017-03-23 NOTE — Telephone Encounter (Signed)
Pt calling.  She was unable to get rx from pharm.  They need something from Dr's office before it can be filled.  Pharm sugg she get samples in the meantime.  [phone]

## 2017-03-24 LAB — IGP,CTNGTV,APT HPV,RFX16/18,45
CHLAMYDIA, NUC. ACID AMP: NEGATIVE
GONOCOCCUS, NUC. ACID AMP: NEGATIVE
HPV APTIMA: NEGATIVE
PAP SMEAR COMMENT: 0
TRICH VAG BY NAA: NEGATIVE

## 2017-03-30 ENCOUNTER — Encounter: Payer: 59 | Admitting: Obstetrics and Gynecology

## 2017-03-30 ENCOUNTER — Other Ambulatory Visit: Payer: 59

## 2017-04-05 ENCOUNTER — Encounter: Payer: Self-pay | Admitting: Obstetrics and Gynecology

## 2017-04-06 ENCOUNTER — Ambulatory Visit (INDEPENDENT_AMBULATORY_CARE_PROVIDER_SITE_OTHER): Payer: 59

## 2017-04-06 ENCOUNTER — Ambulatory Visit (INDEPENDENT_AMBULATORY_CARE_PROVIDER_SITE_OTHER): Payer: 59 | Admitting: Obstetrics and Gynecology

## 2017-04-06 VITALS — BP 110/60 | Wt 155.0 lb

## 2017-04-06 DIAGNOSIS — Z362 Encounter for other antenatal screening follow-up: Secondary | ICD-10-CM | POA: Diagnosis not present

## 2017-04-06 DIAGNOSIS — M545 Low back pain, unspecified: Secondary | ICD-10-CM

## 2017-04-06 DIAGNOSIS — Z3490 Encounter for supervision of normal pregnancy, unspecified, unspecified trimester: Secondary | ICD-10-CM

## 2017-04-06 DIAGNOSIS — Z3A12 12 weeks gestation of pregnancy: Secondary | ICD-10-CM

## 2017-04-06 DIAGNOSIS — Z98891 History of uterine scar from previous surgery: Secondary | ICD-10-CM

## 2017-04-06 DIAGNOSIS — Z3A09 9 weeks gestation of pregnancy: Secondary | ICD-10-CM

## 2017-04-06 DIAGNOSIS — G8929 Other chronic pain: Secondary | ICD-10-CM

## 2017-04-06 NOTE — Progress Notes (Signed)
Dating U/S today/no urine sample provided

## 2017-04-06 NOTE — Patient Instructions (Signed)

## 2017-04-06 NOTE — Progress Notes (Signed)
SD

## 2017-05-04 ENCOUNTER — Ambulatory Visit (INDEPENDENT_AMBULATORY_CARE_PROVIDER_SITE_OTHER): Payer: 59 | Admitting: Advanced Practice Midwife

## 2017-05-04 ENCOUNTER — Encounter: Payer: 59 | Admitting: Obstetrics and Gynecology

## 2017-05-04 VITALS — BP 108/68 | Wt 156.0 lb

## 2017-05-04 DIAGNOSIS — R51 Headache: Secondary | ICD-10-CM

## 2017-05-04 DIAGNOSIS — R519 Headache, unspecified: Secondary | ICD-10-CM

## 2017-05-04 DIAGNOSIS — Z363 Encounter for antenatal screening for malformations: Secondary | ICD-10-CM

## 2017-05-04 DIAGNOSIS — Z3A16 16 weeks gestation of pregnancy: Secondary | ICD-10-CM

## 2017-05-04 MED ORDER — BUTALBITAL-APAP-CAFFEINE 50-325-40 MG PO CAPS: 1.0000 | ORAL_CAPSULE | Freq: Four times a day (QID) | ORAL | 0 refills | Status: DC | PRN

## 2017-05-04 NOTE — Progress Notes (Signed)
Having frequent frontal headaches and requesting Rx for medication she took with a different pregnancy. Fioricet Rx today. Recommended increased hydration, adequate sleep and staying away from other HA triggers.

## 2017-05-04 NOTE — Progress Notes (Signed)
ROB

## 2017-05-13 ENCOUNTER — Ambulatory Visit (INDEPENDENT_AMBULATORY_CARE_PROVIDER_SITE_OTHER): Payer: 59 | Admitting: Advanced Practice Midwife

## 2017-05-13 VITALS — BP 114/68 | Wt 154.0 lb

## 2017-05-13 DIAGNOSIS — Z3A17 17 weeks gestation of pregnancy: Secondary | ICD-10-CM

## 2017-05-13 NOTE — Progress Notes (Signed)
Patient describes pain as lower abdomen. She had the intermittent pain for about an hour last night. It was every 5-10 minutes and lasted for about 30 seconds each time. She has not had any recurrence since then. She admits to being stressed the past couple of days and to possible dehydration. She is encouraged to eat healthy foods, stay hydrated, try rice sock or soaking in the tub, and decrease activity today. She is unsure if she has felt the baby move yet. She denies LOF, VB. Precautions given.

## 2017-05-13 NOTE — Progress Notes (Signed)
[employer] in [employer], pain in stomach.  No vb. No lof

## 2017-06-01 ENCOUNTER — Ambulatory Visit (INDEPENDENT_AMBULATORY_CARE_PROVIDER_SITE_OTHER): Payer: 59

## 2017-06-01 ENCOUNTER — Ambulatory Visit (INDEPENDENT_AMBULATORY_CARE_PROVIDER_SITE_OTHER): Payer: 59 | Admitting: Obstetrics and Gynecology

## 2017-06-01 VITALS — BP 116/70 | Wt 158.0 lb

## 2017-06-01 DIAGNOSIS — O0990 Supervision of high risk pregnancy, unspecified, unspecified trimester: Secondary | ICD-10-CM

## 2017-06-01 DIAGNOSIS — Z3A20 20 weeks gestation of pregnancy: Secondary | ICD-10-CM

## 2017-06-01 DIAGNOSIS — Z363 Encounter for antenatal screening for malformations: Secondary | ICD-10-CM

## 2017-06-01 DIAGNOSIS — Z98891 History of uterine scar from previous surgery: Secondary | ICD-10-CM

## 2017-06-01 NOTE — Progress Notes (Signed)
Prenatal Visit Note Date: 06/01/2017 Clinic: [facility]  Subjective:  Denise Houston is a 31 y.o. G1P0 at 3247w0d being seen today for ongoing prenatal care.  She is currently monitored for the following issues for this high-risk pregnancy and has Supervision of high risk pregnancy, antepartum; History of cesarean delivery; and Chronic low back pain without sciatica on her problem list.   ----------------------------------------------------------------------------------- Patient reports no bleeding, no contractions and no leaking.   Contractions: Not present. Vag. Bleeding: None.  Movement: Present. Denies leaking of fluid.  Anatomy u/s incomplete today. Will get completion at next visit. Patient asks about c-section versus TOLAC s/p 2 c-sections. Discussed this at some length. She is still considering. ----------------------------------------------------------------------------------- The following portions of the patient's history were reviewed and updated as appropriate: allergies, current medications, past family history, past medical history, past social history, past surgical history and problem list. Problem list updated.  Objective:   Vitals:   06/01/17 0935  BP: 116/70  Weight: 158 lb (71.7 kg)   Total weight gain for pregnancy: 6 lb (2.722 kg)   Fetal Status: Fetal Heart Rate (bpm): 145   Movement: Present     General:  Alert, oriented and cooperative. Patient is in no acute distress.  Skin: Skin is warm and dry. No rash noted.   Cardiovascular: Normal heart rate noted  Respiratory: Normal respiratory effort, no problems with respiration noted  Abdomen: Soft, gravid, appropriate for gestational age. Pain/Pressure: Present     Pelvic:  Cervical exam deferred        Extremities: Normal range of motion.  Edema: None  Mental Status: Normal mood and affect. Normal behavior. Normal judgment and thought content.   Urinalysis: Urine Protein: Negative Urine Glucose:  Negative  Assessment and Plan:  Pregnancy: G1P0 at 6647w0d  1. History of cesarean delivery -considering TOLAC 2. Supervision of high risk pregnancy, antepartum - US OB Follow Up; Future (completion views NV) 3. [redacted] weeks gestation of pregnancy  Preterm labor symptoms and general obstetric precautions including but not limited to vaginal bleeding, contractions, leaking of fluid and fetal movement were reviewed in detail with the patient. Please refer to After Visit Summary for other counseling recommendations.   Return in about 2 weeks (around 06/15/2017) for schedule u/s for completion anatomy and routine prenatal.  Thomasene MohairStephen Tyrel Lex, MD 06/01/2017 9:52 AM

## 2017-06-16 ENCOUNTER — Other Ambulatory Visit: Payer: 59

## 2017-06-16 ENCOUNTER — Encounter: Payer: 59 | Admitting: Obstetrics & Gynecology

## 2017-06-23 ENCOUNTER — Ambulatory Visit (INDEPENDENT_AMBULATORY_CARE_PROVIDER_SITE_OTHER): Payer: Medicaid Other | Admitting: Obstetrics & Gynecology

## 2017-06-23 ENCOUNTER — Ambulatory Visit (INDEPENDENT_AMBULATORY_CARE_PROVIDER_SITE_OTHER): Payer: Medicaid Other

## 2017-06-23 VITALS — BP 120/70 | Wt 166.0 lb

## 2017-06-23 DIAGNOSIS — Z3A23 23 weeks gestation of pregnancy: Secondary | ICD-10-CM

## 2017-06-23 DIAGNOSIS — O0990 Supervision of high risk pregnancy, unspecified, unspecified trimester: Secondary | ICD-10-CM | POA: Diagnosis not present

## 2017-06-23 DIAGNOSIS — Z362 Encounter for other antenatal screening follow-up: Secondary | ICD-10-CM

## 2017-06-23 DIAGNOSIS — Z98891 History of uterine scar from previous surgery: Secondary | ICD-10-CM

## 2017-06-23 NOTE — Patient Instructions (Signed)

## 2017-06-23 NOTE — Progress Notes (Signed)
PNV. Glc nv.  CS vs TOLAC discussed. Review of ULTRASOUND.  Discussed EICF.    [facility]    Plan of management to be discussed with patient.

## 2017-06-29 NOTE — Telephone Encounter (Signed)
Pt has since been seen.

## 2017-07-21 ENCOUNTER — Ambulatory Visit (INDEPENDENT_AMBULATORY_CARE_PROVIDER_SITE_OTHER): Payer: Medicaid Other | Admitting: Obstetrics and Gynecology

## 2017-07-21 ENCOUNTER — Other Ambulatory Visit: Payer: Medicaid Other

## 2017-07-21 VITALS — BP 124/74 | Wt 167.0 lb

## 2017-07-21 DIAGNOSIS — Z98891 History of uterine scar from previous surgery: Secondary | ICD-10-CM

## 2017-07-21 DIAGNOSIS — Z3A23 23 weeks gestation of pregnancy: Secondary | ICD-10-CM

## 2017-07-21 DIAGNOSIS — M545 Low back pain, unspecified: Secondary | ICD-10-CM

## 2017-07-21 DIAGNOSIS — F4321 Adjustment disorder with depressed mood: Secondary | ICD-10-CM

## 2017-07-21 DIAGNOSIS — F4320 Adjustment disorder, unspecified: Secondary | ICD-10-CM

## 2017-07-21 DIAGNOSIS — Z3A27 27 weeks gestation of pregnancy: Secondary | ICD-10-CM

## 2017-07-21 DIAGNOSIS — O0990 Supervision of high risk pregnancy, unspecified, unspecified trimester: Secondary | ICD-10-CM

## 2017-07-21 DIAGNOSIS — G8929 Other chronic pain: Secondary | ICD-10-CM

## 2017-07-21 MED ORDER — TRAZODONE HCL 50 MG PO TABS: 50.0000 mg | ORAL_TABLET | Freq: Every evening | ORAL | 6 refills | Status: DC | PRN

## 2017-07-21 NOTE — Progress Notes (Signed)
ROB 28 week labs Pt tearful/FOB committed suicide few days ago. Inocente Salles form given

## 2017-07-22 LAB — 28 WEEK RH+PANEL
BASOS: 0 %
Basophils Absolute: 0 10*3/uL (ref 0.0–0.2)
EOS (ABSOLUTE): 0.1 10*3/uL (ref 0.0–0.4)
EOS: 1 %
GESTATIONAL DIABETES SCREEN: 134 mg/dL (ref 65–139)
HEMATOCRIT: 32.4 % — AB (ref 34.0–46.6)
HEMOGLOBIN: 10.4 g/dL — AB (ref 11.1–15.9)
HIV Screen 4th Generation wRfx: NONREACTIVE
IMMATURE GRANULOCYTES: 0 %
Immature Grans (Abs): 0 10*3/uL (ref 0.0–0.1)
LYMPHS ABS: 1.3 10*3/uL (ref 0.7–3.1)
Lymphs: 10 %
MCH: 30.1 pg (ref 26.6–33.0)
MCHC: 32.1 g/dL (ref 31.5–35.7)
MCV: 94 fL (ref 79–97)
Monocytes Absolute: 0.8 10*3/uL (ref 0.1–0.9)
Monocytes: 6 %
NEUTROS ABS: 10.6 10*3/uL — AB (ref 1.4–7.0)
Neutrophils: 83 %
Platelets: 250 10*3/uL (ref 150–379)
RBC: 3.45 x10E6/uL — AB (ref 3.77–5.28)
RDW: 13.3 % (ref 12.3–15.4)
RPR: NONREACTIVE
WBC: 12.8 10*3/uL — AB (ref 3.4–10.8)

## 2017-07-22 NOTE — Progress Notes (Signed)
    Routine Prenatal Care Visit  Subjective  Denise Houston is a 31 y.o. G1P0 at [redacted]w[redacted]d being seen today for ongoing prenatal care.  She is currently monitored for the following issues for this low-risk pregnancy and has Supervision of high risk pregnancy, antepartum; History of cesarean delivery; and Chronic low back pain without sciatica on her problem list.  ----------------------------------------------------------------------------------- Patient reports no complaints.   Contractions: Not present. Vag. Bleeding: None.  Movement: Present. Denies leaking of fluid.  ----------------------------------------------------------------------------------- The following portions of the patient's history were reviewed and updated as appropriate: allergies, current medications, past family history, past medical history, past social history, past surgical history and problem list. Problem list updated.   Objective  Blood pressure 124/74, weight 167 lb (75.8 kg), last menstrual period 01/12/2017. Pregravid weight 152 lb (68.9 kg) Total Weight Gain 15 lb (6.804 kg) Urinalysis: Urine Protein: 1+ Urine Glucose: Negative  Fetal Status: Fetal Heart Rate (bpm): 145 Fundal Height: 28 cm Movement: Present     General:  Alert, oriented and cooperative. Patient is in no acute distress.  Skin: Skin is warm and dry. No rash noted.   Cardiovascular: Normal heart rate noted  Respiratory: Normal respiratory effort, no problems with respiration noted  Abdomen: Soft, gravid, appropriate for gestational age. Pain/Pressure: Absent     Pelvic:  Cervical exam deferred        Extremities: Normal range of motion.     ental Status: Normal mood and affect. Normal behavior. Normal judgment and thought content.     Assessment   31 y.o. G1P0 at [redacted]w[redacted]d by  10/19/2017, by Last Menstrual Period presenting for routine prenatal visit  Plan   pregnancy 3 Problems (from 01/12/17 to present)    Problem Noted Resolved   Supervision of high risk pregnancy, antepartum 03/19/2017 by Nadara Mustard, MD No   Overview Addendum 07/22/2017  9:43 AM by Vena Austria, MD    Clinic [facility] Prenatal Labs  Dating LMP = 12 week [place] Blood type: O/Positive/-- (05/25 1036)   Genetic Screen Declines Antibody:Negative (05/25 1036)  Anatomic [place]  Rubella: 1.68 (05/25 1036) Varicella: Immune  GTT Early:               Third trimester:  RPR: Non Reactive (05/25 1036)   Rhogam N/A HBsAg: Negative (05/25 1036)   TDaP vaccine                       Flu Shot: HIV: Non Reactive (05/25 1036)   Baby Food                                GBS:   Contraception  Pap:  CBB     CS/VBAC    Support Person                  Preterm labor symptoms and general obstetric precautions including but not limited to vaginal bleeding, contractions, leaking of fluid and fetal movement were reviewed in detail with the patient. Please refer to After Visit Summary for other counseling recommendations.  - 28 week labs today - Trazodone as sleep aid in light of recent suicide of FOB - Continue close follow up as increased risk of depression - Discussed community resources and potential of seeing a grief counselor  Return in about 1 week (around 07/28/2017) for Denise Houston ROB.

## 2017-07-27 ENCOUNTER — Ambulatory Visit (INDEPENDENT_AMBULATORY_CARE_PROVIDER_SITE_OTHER): Payer: Medicaid Other | Admitting: Obstetrics and Gynecology

## 2017-07-27 DIAGNOSIS — O99019 Anemia complicating pregnancy, unspecified trimester: Secondary | ICD-10-CM | POA: Insufficient documentation

## 2017-07-27 DIAGNOSIS — O99013 Anemia complicating pregnancy, third trimester: Secondary | ICD-10-CM

## 2017-07-27 MED ORDER — FERROUS SULFATE 325 (65 FE) MG PO TABS: 325.0000 mg | ORAL_TABLET | Freq: Every day | ORAL | 3 refills | Status: DC

## 2017-07-27 NOTE — Progress Notes (Signed)
Low stomach pain/low back

## 2017-07-28 NOTE — Progress Notes (Signed)
Routine Prenatal Care Visit  Subjective  Denise Houston is a 31 y.o. G1P0 at [redacted]w[redacted]d being seen today for ongoing prenatal care.  She is currently monitored for the following issues for this high-risk pregnancy and has Supervision of high risk pregnancy, antepartum; History of cesarean delivery; Chronic low back pain without sciatica; and Anemia complicating pregnancy on her problem list.  ----------------------------------------------------------------------------------- Patient reports backache.  Lumbago without dysuria.  She has a history of chronic back pain.  Funeral for FOB was last week.  She has been doing as well as can be expected given the circumstances.  Does feel that trazadone has helped her get some sleep at night.  We discussed some of the grief counseling resources available through [facility].  I also gave her the number for Mrs Roderic Ovens who expressed a willingness to talk with the patient as she has similarly lost her husband recently and has two young girls as well.   Contractions: Not present. Vag. Bleeding: None.  Movement: Present. Denies leaking of fluid.  ----------------------------------------------------------------------------------- The following portions of the patient's history were reviewed and updated as appropriate: allergies, current medications, past family history, past medical history, past social history, past surgical history and problem list. Problem list updated.   Objective  Blood pressure 104/68, weight 166 lb (75.3 kg), last menstrual period 01/12/2017. Pregravid weight 152 lb (68.9 kg) Total Weight Gain 14 lb (6.35 kg) Urinalysis:      Fetal Status: Fetal Heart Rate (bpm): 140 Fundal Height: 29 cm Movement: Present     General:  Alert, oriented and cooperative. Patient is in no acute distress.  Skin: Skin is warm and dry. No rash noted.   Cardiovascular: Normal heart rate noted  Respiratory: Normal respiratory effort, no problems with  respiration noted  Abdomen: Soft, gravid, appropriate for gestational age. Pain/Pressure: Absent     Pelvic:  Cervical exam deferred        Extremities: Normal range of motion.     ental Status: Normal mood and affect. Normal behavior. Normal judgment and thought content.     Assessment   31 y.o. G1P0 at [redacted]w[redacted]d by  10/19/2017, by Last Menstrual Period presenting for routine prenatal visit  Plan   pregnancy 3 Problems (from 01/12/17 to present)    Problem Noted Resolved   Anemia complicating pregnancy 07/27/2017 by Vena Austria, MD No   Overview Signed 07/27/2017  4:24 PM by Vena Austria, MD    Started iron on 07/27/2017      Supervision of high risk pregnancy, antepartum 03/19/2017 by Nadara Mustard, MD No   Overview Addendum 07/22/2017  9:43 AM by Vena Austria, MD    Clinic [facility] Prenatal Labs  Dating LMP = 12 week [place] Blood type: O/Positive/-- (05/25 1036)   Genetic Screen Declines Antibody:Negative (05/25 1036)  Anatomic [place]  Rubella: 1.68 (05/25 1036) Varicella: Immune  GTT Early:               Third trimester:  RPR: Non Reactive (05/25 1036)   Rhogam N/A HBsAg: Negative (05/25 1036)   TDaP vaccine                       Flu Shot: HIV: Non Reactive (05/25 1036)   Baby Food                                GBS:   Contraception  Pap:  CBB     CS/VBAC    Support Person                  Preterm labor symptoms and general obstetric precautions including but not limited to vaginal bleeding, contractions, leaking of fluid and fetal movement were reviewed in detail with the patient. Please refer to After Visit Summary for other counseling recommendations.   Return in about 1 week (around 08/03/2017) for ROB-Raahil Ong.

## 2017-08-04 ENCOUNTER — Ambulatory Visit (INDEPENDENT_AMBULATORY_CARE_PROVIDER_SITE_OTHER): Payer: Medicaid Other | Admitting: Obstetrics and Gynecology

## 2017-08-04 VITALS — BP 98/60 | Wt 171.0 lb

## 2017-08-04 DIAGNOSIS — Z98891 History of uterine scar from previous surgery: Secondary | ICD-10-CM

## 2017-08-04 DIAGNOSIS — O0990 Supervision of high risk pregnancy, unspecified, unspecified trimester: Secondary | ICD-10-CM

## 2017-08-04 DIAGNOSIS — Z3A29 29 weeks gestation of pregnancy: Secondary | ICD-10-CM

## 2017-08-04 NOTE — Progress Notes (Signed)
    Routine Prenatal Care Visit  Subjective  Denise Houston is a 31 y.o. G1P0 at [redacted]w[redacted]d being seen today for ongoing prenatal care.  She is currently monitored for the following issues for this high-risk pregnancy and has Supervision of high risk pregnancy, antepartum; History of cesarean delivery; Chronic low back pain without sciatica; and Anemia complicating pregnancy on her problem list.  ----------------------------------------------------------------------------------- Patient reports no complaints.    .  .   . Denies leaking of fluid.  ----------------------------------------------------------------------------------- The following portions of the patient's history were reviewed and updated as appropriate: allergies, current medications, past family history, past medical history, past social history, past surgical history and problem list. Problem list updated.   Objective  Blood pressure 98/60, weight 171 lb (77.6 kg), last menstrual period 01/12/2017. Pregravid weight 152 lb (68.9 kg) Total Weight Gain 19 lb (8.618 kg) Urinalysis: Urine Protein: Negative Urine Glucose: Negative  Fetal Status:           General:  Alert, oriented and cooperative. Patient is in no acute distress.  Skin: Skin is warm and dry. No rash noted.   Cardiovascular: Normal heart rate noted  Respiratory: Normal respiratory effort, no problems with respiration noted  Abdomen: Soft, gravid, appropriate for gestational age.       Pelvic:  Cervical exam deferred        Extremities: Normal range of motion.     ental Status: Normal mood and affect. Normal behavior. Normal judgment and thought content.     Assessment   31 y.o. G1P0 at [redacted]w[redacted]d by  10/19/2017, by Last Menstrual Period presenting for routine prenatal visit  Plan   pregnancy 3 Problems (from 01/12/17 to present)    Problem Noted Resolved   Anemia complicating pregnancy 07/27/2017 by Vena Austria, MD No   Overview Signed 07/27/2017  4:24 PM  by Vena Austria, MD    Started iron on 07/27/2017      Supervision of high risk pregnancy, antepartum 03/19/2017 by Nadara Mustard, MD No   Overview Addendum 07/22/2017  9:43 AM by Vena Austria, MD    Clinic [facility] Prenatal Labs  Dating LMP = 12 week [place] Blood type: O/Positive/-- (05/25 1036)   Genetic Screen Declines Antibody:Negative (05/25 1036)  Anatomic [place]  Rubella: 1.68 (05/25 1036) Varicella: Immune  GTT Early:               Third trimester:  RPR: Non Reactive (05/25 1036)   Rhogam N/A HBsAg: Negative (05/25 1036)   TDaP vaccine                       Flu Shot: HIV: Non Reactive (05/25 1036)   Baby Food                                GBS:   Contraception  Pap:  CBB     CS/VBAC    Support Person                  There is no immunization history on file for this patient.   Preterm labor symptoms and general obstetric precautions including but not limited to vaginal bleeding, contractions, leaking of fluid and fetal movement were reviewed in detail with the patient. Please refer to After Visit Summary for other counseling recommendations.   Return in about 1 week (around 08/11/2017) for ROB.  - considering tolac - undecided tubal

## 2017-08-04 NOTE — Progress Notes (Signed)
ROB

## 2017-08-11 ENCOUNTER — Encounter: Payer: Medicaid Other | Admitting: Obstetrics and Gynecology

## 2017-08-11 ENCOUNTER — Ambulatory Visit (INDEPENDENT_AMBULATORY_CARE_PROVIDER_SITE_OTHER): Payer: 59 | Admitting: Obstetrics and Gynecology

## 2017-08-11 VITALS — BP 100/86 | Wt 172.0 lb

## 2017-08-11 DIAGNOSIS — Z3A30 30 weeks gestation of pregnancy: Secondary | ICD-10-CM

## 2017-08-11 DIAGNOSIS — Z23 Encounter for immunization: Secondary | ICD-10-CM | POA: Diagnosis not present

## 2017-08-11 DIAGNOSIS — M545 Low back pain, unspecified: Secondary | ICD-10-CM

## 2017-08-11 DIAGNOSIS — O99013 Anemia complicating pregnancy, third trimester: Secondary | ICD-10-CM

## 2017-08-11 DIAGNOSIS — Z98891 History of uterine scar from previous surgery: Secondary | ICD-10-CM

## 2017-08-11 DIAGNOSIS — O0990 Supervision of high risk pregnancy, unspecified, unspecified trimester: Secondary | ICD-10-CM

## 2017-08-11 DIAGNOSIS — G8929 Other chronic pain: Secondary | ICD-10-CM

## 2017-08-11 NOTE — Progress Notes (Signed)
Body mass index is 26.94 kg/m.

## 2017-08-11 NOTE — Progress Notes (Signed)
ROB TDAP/Blood consent Flu vaccine today

## 2017-08-11 NOTE — Progress Notes (Signed)
Routine Prenatal Care Visit  Subjective  Denise Houston is a 31 y.o. G1P0 at [redacted]w[redacted]d being seen today for ongoing prenatal care.  She is currently monitored for the following issues for this high-risk pregnancy and has Supervision of high risk pregnancy, antepartum; History of cesarean delivery; Chronic low back pain without sciatica; and Anemia complicating pregnancy on her problem list.  ----------------------------------------------------------------------------------- Patient reports no complaints.   Contractions: Not present. Vag. Bleeding: None.  Movement: Present. Denies leaking of fluid.  ----------------------------------------------------------------------------------- The following portions of the patient's history were reviewed and updated as appropriate: allergies, current medications, past family history, past medical history, past social history, past surgical history and problem list. Problem list updated.   Objective  Blood pressure 100/86, weight 172 lb (78 kg), last menstrual period 01/12/2017. Pregravid weight 152 lb (68.9 kg) Total Weight Gain 20 lb (9.072 kg) Urinalysis: Urine Protein: Negative Urine Glucose: Negative  Fetal Status: Fetal Heart Rate (bpm): 135 Fundal Height: 30 cm Movement: Present     General:  Alert, oriented and cooperative. Patient is in no acute distress.  Skin: Skin is warm and dry. No rash noted.   Cardiovascular: Normal heart rate noted  Respiratory: Normal respiratory effort, no problems with respiration noted  Abdomen: Soft, gravid, appropriate for gestational age. Pain/Pressure: Absent     Pelvic:  Cervical exam deferred        Extremities: Normal range of motion.     ental Status: Normal mood and affect. Normal behavior. Normal judgment and thought content.   Immunization History  Administered Date(s) Administered  . Influenza,inj,Quad PF,6+ Mos 08/11/2017  . Tdap 08/11/2017     Assessment   31 y.o. G1P0 at [redacted]w[redacted]d by   10/19/2017, by Last Menstrual Period presenting for routine prenatal visit  Plan   pregnancy 3 Problems (from 01/12/17 to present)    Problem Noted Resolved   Anemia complicating pregnancy 07/27/2017 by Vena Austria, MD No   Overview Signed 07/27/2017  4:24 PM by Vena Austria, MD    Started iron on 07/27/2017      Supervision of high risk pregnancy, antepartum 03/19/2017 by Nadara Mustard, MD No   Overview Addendum 07/22/2017  9:43 AM by Vena Austria, MD    Clinic [facility] Prenatal Labs  Dating LMP = 12 week [place] Blood type: O/Positive/-- (05/25 1036)   Genetic Screen Declines Antibody:Negative (05/25 1036)  Anatomic [place]  Rubella: 1.68 (05/25 1036) Varicella: Immune  GTT Early:               Third trimester:  RPR: Non Reactive (05/25 1036)   Rhogam N/A HBsAg: Negative (05/25 1036)   TDaP vaccine 08/11/17  Flu Shot: 08/11/17 HIV: Non Reactive (05/25 1036)   Baby Food                                GBS:   Contraception  Pap:  CBB     CS/VBAC    Support Person                  Preterm labor symptoms and general obstetric precautions including but not limited to vaginal bleeding, contractions, leaking of fluid and fetal movement were reviewed in detail with the patient. Please refer to After Visit Summary for other counseling recommendations.   31 y.o. G1P0 at [redacted]w[redacted]d with Estimated Date of Delivery: 10/19/17 was seen today in office to discuss trial of labor after cesarean  section (TOLAC) versus elective repeat cesarean delivery (ERCD). The following risks were discussed with the patient.  Risk of uterine rupture at term is 0.78 percent with TOLAC and 0.22 percent with ERCD. 1 in 10 uterine ruptures will result in neonatal death or neurological injury. The benefits of a trial of labor after cesarean (TOLAC) resulting in a vaginal birth after cesarean (VBAC) include the following: shorter length of hospital stay and postpartum recovery (in most cases); fewer complications,  such as postpartum fever, wound or uterine infection, thromboembolism (blood clots in the leg or lung), need for blood transfusion and fewer neonatal breathing problems. The risks of an attempted VBAC or TOLAC include the following: Risk of failed trial of labor after cesarean (TOLAC) without a vaginal birth after cesarean (VBAC) resulting in repeat cesarean delivery (RCD) in about 20 to 40 percent of women who attempt VBAC.  Her individualized success rate using the MFMU VBAC risk calculator is 71%.   Risk of rupture of uterus resulting in an emergency cesarean delivery. The risk of uterine rupture may be related in part to the type of uterine incision made during the first cesarean delivery. A previous transverse uterine incision has the lowest risk of rupture (0.2 to 1.5 percent risk). Vertical or T-shaped uterine incisions have a higher risk of uterine rupture (4 to 9 percent risk)The risk of fetal death is very low with both VBAC and elective repeat cesarean delivery (ERCD), but the likelihood of fetal death is higher with VBAC than with ERCD. Maternal death is very rare with either type of delivery. The risks of an elective repeat cesarean delivery (ERCD) were reviewed with the patient including but not limited to: 11/998 risk of uterine rupture which could have serious consequences, bleeding which may require transfusion; infection which may require antibiotics; injury to bowel, bladder or other surrounding organs (bowel, bladder, ureters); injury to the fetus; need for additional procedures including hysterectomy in the event of a life-threatening hemorrhage; thromboembolic phenomenon; abnormal placentation; incisional problems; death and other postoperative or anesthesia complications.    In addition we discussed that our collective office practice is to allow patient's who desire to attempt TOLAC to go into labor naturally.  There is some limited data that rupture rate may increase past [redacted] weeks  gestation, but it is reasonable for women who are strongly committed to [facility] to continue pregnancy into the 41st week.  Medical indications necessetating early delivery may arise during the course of any pregnancy.  Given the contraindication on the use of prostaglandins for use in cervical ripening,  recommendation would be to proceed with repeat cesarean for delivery for patient's with unfavorable cervix (low Bishops score) who reach 41 weeks or who otherwise have a medical indication for early delivery.   These risks and benefits are summarized on the consent form, which was reviewed with the patient during the visit.  All her questions answered and she signed a consent indicating a preference for TOLAC/ERCD. A copy of the consent was given to the patient.  Previous studies examining have noted an increased risk or maternal and fetal morbidity for patient attempting a trial of labor whose success rated is <70% compared to [facility], while morbidity was similar for patient attempting a trial of labor vs ERCS with success rates >70. ("Can a prediction model for vaginal birth after cesarean section also predict the probablity of  Morbidity related to a trial of labor?" American Jourcal of Obstetric and Gynecology 2009  January)  Vena AustriaAndreas Aneth Schlagel, MD,  [facility]   Return in about 1 week (around 08/18/2017) for ROB.

## 2017-08-17 ENCOUNTER — Ambulatory Visit (INDEPENDENT_AMBULATORY_CARE_PROVIDER_SITE_OTHER): Payer: Medicaid Other | Admitting: Obstetrics and Gynecology

## 2017-08-17 VITALS — BP 104/68 | Wt 176.0 lb

## 2017-08-17 DIAGNOSIS — Z98891 History of uterine scar from previous surgery: Secondary | ICD-10-CM

## 2017-08-17 DIAGNOSIS — O0990 Supervision of high risk pregnancy, unspecified, unspecified trimester: Secondary | ICD-10-CM

## 2017-08-17 DIAGNOSIS — Z3A31 31 weeks gestation of pregnancy: Secondary | ICD-10-CM

## 2017-08-17 DIAGNOSIS — O99013 Anemia complicating pregnancy, third trimester: Secondary | ICD-10-CM

## 2017-08-17 MED ORDER — SERTRALINE HCL 50 MG PO TABS: 50.0000 mg | ORAL_TABLET | Freq: Every day | ORAL | 2 refills | Status: DC

## 2017-08-17 NOTE — Progress Notes (Signed)
ROB No concerns 

## 2017-08-23 ENCOUNTER — Telehealth: Payer: Self-pay | Admitting: Obstetrics and Gynecology

## 2017-08-23 NOTE — Telephone Encounter (Signed)
Erskine SquibbJane please advise or call patient. Thank you

## 2017-08-23 NOTE — Telephone Encounter (Signed)
Pt is calling today with the complaint of Headache, light headiness, and Dizzy. There are no cancellations at this time. Please advise over booking for poss BP check.

## 2017-08-24 ENCOUNTER — Ambulatory Visit (INDEPENDENT_AMBULATORY_CARE_PROVIDER_SITE_OTHER): Payer: Medicaid Other | Admitting: Advanced Practice Midwife

## 2017-08-24 VITALS — BP 112/68 | Wt 177.0 lb

## 2017-08-24 DIAGNOSIS — Z3A32 32 weeks gestation of pregnancy: Secondary | ICD-10-CM

## 2017-08-24 NOTE — Patient Instructions (Signed)
Vaginal Birth After Cesarean Delivery Vaginal birth after cesarean delivery (VBAC) is giving birth vaginally after previously delivering a baby by a cesarean. In the past, if a woman had a cesarean delivery, all births afterward would be done by cesarean delivery. This is no longer true. It can be safe for the mother to try a vaginal delivery after having a cesarean delivery. It is important to discuss VBAC with your health care provider early in the pregnancy so you can understand the risks, benefits, and options. It will give you time to decide what is best in your particular case. The final decision about whether to have a VBAC or repeat cesarean delivery should be between you and your health care provider. Any changes in your health or your baby's health during your pregnancy may make it necessary to change your initial decision about VBAC. Women who plan to have a VBAC should check with their health care provider to be sure that:  The previous cesarean delivery was done with a low transverse uterine cut (incision) (not a vertical classical incision).  The birth canal is big enough for the baby.  There were no other operations on the uterus.  An electronic fetal monitor (EFM) will be on at all times during labor.  An operating room will be available and ready in case an emergency cesarean delivery is needed.  A health care provider and surgical nursing staff will be available at all times during labor to be ready to do an emergency delivery cesarean if necessary.  An anesthesiologist will be present in case an emergency cesarean delivery is needed.  The nursery is prepared and has adequate personnel and necessary equipment available to care for the baby in case of an emergency cesarean delivery. Benefits of VBAC  Shorter stay in the hospital.  Avoidance of risks associated with cesarean delivery, such as: ? Surgical complications, such as opening of the incision or hernia in the  incision. ? Injury to other organs. ? Fever. This can occur if an infection develops after surgery. It can also occur as a reaction to the medicine given to make you numb during the surgery.  Less blood loss and need for blood transfusions.  Lower risk of blood clots and infection.  Shorter recovery.  Decreased risk for having to remove the uterus (hysterectomy).  Decreased risk for the placenta to completely or partially cover the opening of the uterus (placenta previa) with a future pregnancy.  Decrease risk in future labor and delivery. Risks of a VBAC  Tearing (rupture) of the uterus. This is occurs in less than 1% of VBACs. The risk of this happening is higher if: ? Steps are taken to begin the labor process (induce labor) or stimulate or strengthen contractions (augment labor). ? Medicine is used to soften (ripen) the cervix.  Having to remove the uterus (hysterectomy) if it ruptures. VBAC should not be done if:  The previous cesarean delivery was done with a vertical (classical) or T-shaped incision or you do not know what kind of incision was made.  You had a ruptured uterus.  You have had certain types of surgery on your uterus, such as removal of uterine fibroids. Ask your health care provider about other types of surgeries that prevent you from having a VBAC.  You have certain medical or childbirth (obstetrical) problems.  There are problems with the baby.  You have had two previous cesarean deliveries and no vaginal deliveries. Other facts to know about VBAC:  It   is safe to have an epidural anesthetic with VBAC.  It is safe to turn the baby from a breech position (attempt an external cephalic version).  It is safe to try a VBAC with twins.  VBAC may not be successful if your baby weights 8.8 lb (4 kg) or more. However, weight predictions are not always accurate and should not be used alone to decide if VBAC is right for you.  There is an increased failure rate  if the time between the cesarean delivery and VBAC is less than 19 months.  Your health care provider may advise against a VBAC if you have preeclampsia (high blood pressure, protein in the urine, and swelling of face and extremities).  VBAC is often successful if you previously gave birth vaginally.  VBAC is often successful when the labor starts spontaneously before the due date.  Delivering a baby through a VBAC is similar to having a normal spontaneous vaginal delivery. This information is not intended to replace advice given to you by your health care provider. Make sure you discuss any questions you have with your health care provider. Document Released: 04/04/2007 Document Revised: 03/19/2016 Document Reviewed: 05/11/2013 Elsevier Interactive Patient Education  2018 [employer]  

## 2017-08-24 NOTE — Telephone Encounter (Signed)
Saw patient today

## 2017-08-24 NOTE — Progress Notes (Signed)
Routine Prenatal Care Visit  Subjective  Denise Houston is a 31 y.o. G1P0 at [redacted]w[redacted]d being seen today for ongoing prenatal care.  She is currently monitored for the following issues for this high-risk pregnancy and has Supervision of high risk pregnancy, antepartum; History of cesarean delivery; Chronic low back pain without sciatica; and Anemia complicating pregnancy on her problem list.  ----------------------------------------------------------------------------------- Patient reports feeling light headed, dizzy, congested, headache, nauseated, vomiting a few times since yesterday. She has not had a great appetite and admits to feeling thirsty and maybe not getting enough protein. She denies fever, diarrhea. Denies leaking of fluid. Denies vaginal bleeding. Denies contractions.  ----------------------------------------------------------------------------------- The following portions of the patient's history were reviewed and updated as appropriate: allergies, current medications, past family history, past medical history, past social history, past surgical history and problem list. Problem list updated.   Objective  Blood pressure 112/68, weight 177 lb (80.3 kg), last menstrual period 01/12/2017. Pregravid weight 152 lb (68.9 kg) Total Weight Gain 25 lb (11.3 kg) Urinalysis: Urine Protein: Negative Urine Glucose: Negative  Fetal Status: positive fetal movement  General:  Alert, oriented and cooperative. Patient is in no acute distress.  Skin: Skin is warm and dry. No rash noted.   Cardiovascular: Normal heart rate noted  Respiratory: Normal respiratory effort, no problems with respiration noted  Abdomen: Soft, gravid, appropriate for gestational age.       Pelvic:  Cervical exam deferred        Extremities: Normal range of motion.     Mental Status: Normal mood and affect. Normal behavior. Normal judgment and thought content.   Assessment   31 y.o. G1P0 at [redacted]w[redacted]d by  10/19/2017, by  Last Menstrual Period presenting for work-in prenatal visit  Plan   pregnancy 3 Problems (from 01/12/17 to present)    Problem Noted Resolved   Anemia complicating pregnancy 07/27/2017 by Vena Austria, MD No   Overview Signed 07/27/2017  4:24 PM by Vena Austria, MD    Started iron on 07/27/2017      Supervision of high risk pregnancy, antepartum 03/19/2017 by Nadara Mustard, MD No   Overview Addendum 08/12/2017 10:35 AM by Vena Austria, MD    [facility] Prenatal Labs  Dating LMP = 12 week [place] Blood type: O/Positive/-- (05/25 1036)   Genetic Screen Declines Antibody:Negative (05/25 1036)  Anatomic [place]  Rubella: 1.68 (05/25 1036) Varicella: Immune  GTT Early:               Third trimester:  RPR: Non Reactive (05/25 1036)   Rhogam N/A HBsAg: Negative (05/25 1036)   TDaP vaccine 08/11/17  Flu Shot: 08/11/17 HIV: Non Reactive (05/25 1036)   Baby Food                                GBS:   Contraception  Pap: 03/24/2017 NIL HPV negative  CBB     CS/VBAC Hx c/s x 2   Support Person                  Preterm labor symptoms and general obstetric precautions including but not limited to vaginal bleeding, contractions, leaking of fluid and fetal movement were reviewed in detail with the patient. Please refer to After Visit Summary for other counseling recommendations.   Rest today- return to work note given for return on 11/5 Increase hydration Increase protein intake Safe medications reviewed- decongestant PRN  Has  f/u already scheduled  Tresea MallJane Samin Milke, CNM  08/24/2017 9:30 AM

## 2017-08-27 ENCOUNTER — Other Ambulatory Visit: Payer: Self-pay | Admitting: Advanced Practice Midwife

## 2017-08-27 ENCOUNTER — Ambulatory Visit (INDEPENDENT_AMBULATORY_CARE_PROVIDER_SITE_OTHER): Payer: Medicaid Other | Admitting: Obstetrics and Gynecology

## 2017-08-27 VITALS — BP 108/52 | Wt 172.0 lb

## 2017-08-27 DIAGNOSIS — M545 Low back pain: Secondary | ICD-10-CM

## 2017-08-27 DIAGNOSIS — Z98891 History of uterine scar from previous surgery: Secondary | ICD-10-CM

## 2017-08-27 DIAGNOSIS — R519 Headache, unspecified: Secondary | ICD-10-CM

## 2017-08-27 DIAGNOSIS — O99013 Anemia complicating pregnancy, third trimester: Secondary | ICD-10-CM

## 2017-08-27 DIAGNOSIS — G8929 Other chronic pain: Secondary | ICD-10-CM

## 2017-08-27 DIAGNOSIS — Z3A32 32 weeks gestation of pregnancy: Secondary | ICD-10-CM

## 2017-08-27 DIAGNOSIS — O0990 Supervision of high risk pregnancy, unspecified, unspecified trimester: Secondary | ICD-10-CM

## 2017-08-27 DIAGNOSIS — R51 Headache: Principal | ICD-10-CM

## 2017-08-27 NOTE — Progress Notes (Signed)
    Routine Prenatal Care Visit  Subjective  Denise Houston is a 31 y.o. G1P0 at 6756w3d being seen today for ongoing prenatal care.  She is currently monitored for the following issues for this high-risk pregnancy and has Supervision of high risk pregnancy, antepartum; History of cesarean delivery; Chronic low back pain without sciatica; and Anemia complicating pregnancy on her problem list.  ----------------------------------------------------------------------------------- Patient reports no complaints.   Contractions: Not present. Vag. Bleeding: None.  Movement: Present. Denies leaking of fluid.  ----------------------------------------------------------------------------------- The following portions of the patient's history were reviewed and updated as appropriate: allergies, current medications, past family history, past medical history, past social history, past surgical history and problem list. Problem list updated.   Objective  Blood pressure (!) 108/52, weight 172 lb (78 kg), last menstrual period 01/12/2017. Pregravid weight 152 lb (68.9 kg) Total Weight Gain 20 lb (9.072 kg) Urinalysis:      Fetal Status: Fetal Heart Rate (bpm): 135 Fundal Height: 32 cm Movement: Present     General:  Alert, oriented and cooperative. Patient is in no acute distress.  Skin: Skin is warm and dry. No rash noted.   Cardiovascular: Normal heart rate noted  Respiratory: Normal respiratory effort, no problems with respiration noted  Abdomen: Soft, gravid, appropriate for gestational age. Pain/Pressure: Absent     Pelvic:  Cervical exam deferred        Extremities: Normal range of motion.     ental Status: Normal mood and affect. Normal behavior. Normal judgment and thought content.     Assessment   31 y.o. G1P0 at 956w3d by  10/19/2017, by Last Menstrual Period presenting for routine prenatal visit  Plan   pregnancy 3 Problems (from 01/12/17 to present)    Problem Noted Resolved   Anemia  complicating pregnancy 07/27/2017 by [name], Deardra Hinkley, MD No   Overview Signed 07/27/2017  4:24 PM by [name], Temeca Somma, MD    Started iron on 07/27/2017      Supervision of high risk pregnancy, antepartum 03/19/2017 by [name], [name], MD No   Overview Addendum 08/12/2017 10:35 AM by [name], Ellee Wawrzyniak, MD    [facility] Prenatal Labs  Dating LMP = 12 week US Blood type: O/Positive/-- (05/25 1036)   Genetic Screen Declines Antibody:Negative (05/25 1036)  Anatomic US  Rubella: 1.68 (05/25 1036) Varicella: Immune  GTT Early:               Third trimester:  RPR: Non Reactive (05/25 1036)   Rhogam N/A HBsAg: Negative (05/25 1036)   TDaP vaccine 08/11/17  Flu Shot: 08/11/17 HIV: Non Reactive (05/25 1036)   Baby Food                                GBS:   Contraception  Pap: 03/24/2017 NIL HPV negative  CBB     CS/VBAC    Support Person                  Preterm labor symptoms and general obstetric precautions including but not limited to vaginal bleeding, contractions, leaking of fluid and fetal movement were reviewed in detail with the patient. Please refer to After Visit Summary for other counseling recommendations.   - BTL paper work signed today  Return in about 1 week (around 09/03/2017).

## 2017-08-27 NOTE — Telephone Encounter (Signed)
Please advise 

## 2017-09-07 ENCOUNTER — Ambulatory Visit (INDEPENDENT_AMBULATORY_CARE_PROVIDER_SITE_OTHER): Payer: 59 | Admitting: Obstetrics and Gynecology

## 2017-09-07 ENCOUNTER — Encounter: Payer: Self-pay | Admitting: Obstetrics and Gynecology

## 2017-09-07 VITALS — BP 116/72 | Wt 172.0 lb

## 2017-09-07 DIAGNOSIS — Z98891 History of uterine scar from previous surgery: Secondary | ICD-10-CM

## 2017-09-07 DIAGNOSIS — R519 Headache, unspecified: Secondary | ICD-10-CM

## 2017-09-07 DIAGNOSIS — Z3A34 34 weeks gestation of pregnancy: Secondary | ICD-10-CM

## 2017-09-07 DIAGNOSIS — O0990 Supervision of high risk pregnancy, unspecified, unspecified trimester: Secondary | ICD-10-CM

## 2017-09-07 DIAGNOSIS — R51 Headache: Secondary | ICD-10-CM

## 2017-09-07 MED ORDER — BUTALBITAL-APAP-CAFFEINE 50-325-40 MG PO CAPS: ORAL_CAPSULE | ORAL | 0 refills | Status: DC

## 2017-09-07 NOTE — Progress Notes (Signed)
ROB Refill on Butalbital

## 2017-09-07 NOTE — Progress Notes (Signed)
Routine Prenatal Care Visit  Subjective  Denise Houston is a 31 y.o. [id number] at 7836w0d being seen today for ongoing prenatal care.  She is currently monitored for the following issues for this high-risk pregnancy and has Supervision of high risk pregnancy, antepartum; History of cesarean delivery; Chronic low back pain without sciatica; and Anemia complicating pregnancy on their problem list.  ----------------------------------------------------------------------------------- Patient reports headache.   Contractions: Not present. Vag. Bleeding: None.  Movement: Present. Denies leaking of fluid.  ----------------------------------------------------------------------------------- The following portions of the patient's history were reviewed and updated as appropriate: allergies, current medications, past family history, past medical history, past social history, past surgical history and problem list. Problem list updated.   Objective  Blood pressure 116/72, weight 172 lb (78 kg), last menstrual period 01/12/2017. Pregravid weight 152 lb (68.9 kg) Total Weight Gain 20 lb (9.072 kg) Urinalysis:      Fetal Status: Fetal Heart Rate (bpm): 135 Fundal Height: 33 cm Movement: Present     General:  Alert, oriented and cooperative. Patient is in no acute distress.  Skin: Skin is warm and dry. No rash noted.   Cardiovascular: Normal heart rate noted  Respiratory: Normal respiratory effort, no problems with respiration noted  Abdomen: Soft, gravid, appropriate for gestational age. Pain/Pressure: Absent     Pelvic:  Cervical exam deferred        Extremities: Normal range of motion.     ental Status: Normal mood and affect. Normal behavior. Normal judgment and thought content.     Assessment   31 y.o. [name] at 9036w0d by  10/19/2017, by Last Menstrual Period presenting for routine prenatal visit  Plan   pregnancy 3 Problems (from 01/12/17 to present)    Problem Noted Resolved   Anemia  complicating pregnancy 07/27/2017 by [name], Kimara Bencomo, MD No   Overview Signed 07/27/2017  4:24 PM by [name], Lonnette Shrode, MD    Started iron on 07/27/2017      Supervision of high risk pregnancy, antepartum 03/19/2017 by [name], [name], MD No   Overview Addendum 09/07/2017 11:26 AM by [name], Kaimana Lurz, MD    [facility] Prenatal Labs  Dating LMP = 12 week US Blood type: O/Positive/-- (05/25 1036)   Genetic Screen Declines Antibody:Negative (05/25 1036)  Anatomic US Normal Female Rubella: 1.68 (05/25 1036) Varicella: Immune  GTT Third trimester: 134  RPR: Non Reactive (05/25 1036)   Rhogam N/A HBsAg: Negative (05/25 1036)   TDaP vaccine 08/11/17  Flu Shot: 08/11/17 HIV: Non Reactive (05/25 1036)   Baby Food                                GBS:   Contraception  Pap: 03/24/2017 NIL HPV negative  CBB     CS/VBAC    Support Person                  Preterm labor symptoms and general obstetric precautions including but not limited to vaginal bleeding, contractions, leaking of fluid and fetal movement were reviewed in detail with the patient. Please refer to After Visit Summary for other counseling recommendations.   - EPDS of 14 item 10 is 0 - Continue Zoloft 50mg  - Obtain growth scan at 36 weeks to help with decision of Cesarean section further currently leaning towards TOLAC - Refill Fioricet for headaches  Return in about 1 week (around 09/14/2017) for ROB 1 weel, ROB and growth 2 weeks.

## 2017-09-14 ENCOUNTER — Encounter: Payer: 59 | Admitting: Obstetrics and Gynecology

## 2017-09-21 ENCOUNTER — Ambulatory Visit (INDEPENDENT_AMBULATORY_CARE_PROVIDER_SITE_OTHER): Payer: 59 | Admitting: Obstetrics and Gynecology

## 2017-09-21 ENCOUNTER — Ambulatory Visit (INDEPENDENT_AMBULATORY_CARE_PROVIDER_SITE_OTHER): Payer: 59

## 2017-09-21 VITALS — BP 102/60 | Wt 178.0 lb

## 2017-09-21 DIAGNOSIS — R519 Headache, unspecified: Secondary | ICD-10-CM

## 2017-09-21 DIAGNOSIS — Z3A34 34 weeks gestation of pregnancy: Secondary | ICD-10-CM | POA: Diagnosis not present

## 2017-09-21 DIAGNOSIS — O0990 Supervision of high risk pregnancy, unspecified, unspecified trimester: Secondary | ICD-10-CM | POA: Diagnosis not present

## 2017-09-21 DIAGNOSIS — R51 Headache: Secondary | ICD-10-CM

## 2017-09-21 DIAGNOSIS — Z3685 Encounter for antenatal screening for Streptococcus B: Secondary | ICD-10-CM

## 2017-09-21 DIAGNOSIS — Z98891 History of uterine scar from previous surgery: Secondary | ICD-10-CM | POA: Diagnosis not present

## 2017-09-21 DIAGNOSIS — Z3A36 36 weeks gestation of pregnancy: Secondary | ICD-10-CM

## 2017-09-21 NOTE — Progress Notes (Signed)
    Routine Prenatal Care Visit  Subjective  Denise Houston is a [age] y.o. [id number] at 5238w0d being seen today for ongoing prenatal care.  She is currently monitored for the following issues for this high-risk pregnancy and has Supervision of high risk pregnancy, antepartum; History of cesarean delivery; Chronic low back pain without sciatica; and Anemia complicating pregnancy on their problem list.  ----------------------------------------------------------------------------------- Patient reports no complaints.   Contractions: Not present. Vag. Bleeding: None.  Movement: Present. Denies leaking of fluid.  ----------------------------------------------------------------------------------- The following portions of the patient's history were reviewed and updated as appropriate: allergies, current medications, past family history, past medical history, past social history, past surgical history and problem list. Problem list updated.   Objective  Blood pressure 102/60, weight 178 lb (80.7 kg), last menstrual period 01/12/2017. Pregravid weight 152 lb (68.9 kg) Total Weight Gain 26 lb (11.8 kg) Urinalysis: Urine Protein: Negative Urine Glucose: Negative  Fetal Status: Fetal Heart Rate (bpm): 135 Fundal Height: 46 cm Movement: Present  Presentation: Vertex  General:  Alert, oriented and cooperative. Patient is in no acute distress.  Skin: Skin is warm and dry. No rash noted.   Cardiovascular: Normal heart rate noted  Respiratory: Normal respiratory effort, no problems with respiration noted  Abdomen: Soft, gravid, appropriate for gestational age. Pain/Pressure: Absent     Pelvic:  Cervical exam performed Dilation: Closed Effacement (%): 50 Station: -3  Extremities: Normal range of motion.     ental Status: Normal mood and affect. Normal behavior. Normal judgment and thought content.     Assessment   31 y.o. [name] at 6838w0d by  10/19/2017, by Last Menstrual Period presenting for routine  prenatal visit  Plan   pregnancy 3 Problems (from 01/12/17 to present)    Problem Noted Resolved   Anemia complicating pregnancy 07/27/2017 by [name], Nisa Decaire, MD No   Overview Signed 07/27/2017  4:24 PM by [name], Jeovani Weisenburger, MD    Started iron on 07/27/2017      Supervision of high risk pregnancy, antepartum 03/19/2017 by [name], [name], MD No   Overview Addendum 09/07/2017 11:26 AM by [name], Shizuye Rupert, MD    [facility] Prenatal Labs  Dating LMP = 12 week US Blood type: O/Positive/-- (05/25 1036)   Genetic Screen Declines Antibody:Negative (05/25 1036)  Anatomic US Normal Female Rubella: 1.68 (05/25 1036) Varicella: Immune  GTT Third trimester: 134  RPR: Non Reactive (05/25 1036)   Rhogam N/A HBsAg: Negative (05/25 1036)   TDaP vaccine 08/11/17  Flu Shot: 08/11/17 HIV: Non Reactive (05/25 1036)   Baby Food                                GBS:   Contraception  Pap: 03/24/2017 NIL HPV negative  CBB     CS/VBAC    Support Person                  Term labor symptoms and general obstetric precautions including but not limited to vaginal bleeding, contractions, leaking of fluid and fetal movement were reviewed in detail with the patient. Please refer to After Visit Summary for other counseling recommendations.  - GBS today  Return in about 1 week (around 09/28/2017) for ROB.

## 2017-09-24 LAB — STREP GP B NAA: Strep Gp B NAA: NEGATIVE

## 2017-09-28 ENCOUNTER — Ambulatory Visit (INDEPENDENT_AMBULATORY_CARE_PROVIDER_SITE_OTHER): Payer: 59 | Admitting: Obstetrics and Gynecology

## 2017-09-28 VITALS — BP 124/70 | Wt 180.0 lb

## 2017-09-28 DIAGNOSIS — O99013 Anemia complicating pregnancy, third trimester: Secondary | ICD-10-CM

## 2017-09-28 DIAGNOSIS — M545 Low back pain, unspecified: Secondary | ICD-10-CM

## 2017-09-28 DIAGNOSIS — Z98891 History of uterine scar from previous surgery: Secondary | ICD-10-CM

## 2017-09-28 DIAGNOSIS — Z3A37 37 weeks gestation of pregnancy: Secondary | ICD-10-CM

## 2017-09-28 DIAGNOSIS — G8929 Other chronic pain: Secondary | ICD-10-CM

## 2017-09-28 DIAGNOSIS — O0990 Supervision of high risk pregnancy, unspecified, unspecified trimester: Secondary | ICD-10-CM

## 2017-09-28 NOTE — Progress Notes (Signed)
No complaints

## 2017-09-28 NOTE — Progress Notes (Signed)
    Routine Prenatal Care Visit  Subjective  Denise Houston is a 31 y.o. [id number] at 6059w0d being seen today for ongoing prenatal care.  She is currently monitored for the following issues for this high-risk pregnancy and has Supervision of high risk pregnancy, antepartum; History of cesarean delivery; Chronic low back pain without sciatica; and Anemia complicating pregnancy on their problem list.  ----------------------------------------------------------------------------------- Patient reports backache and history of chronic back pain but worsening in the third trimester.   Contractions: Irregular. Vag. Bleeding: None.  Movement: Present. Denies leaking of fluid.  ----------------------------------------------------------------------------------- The following portions of the patient's history were reviewed and updated as appropriate: allergies, current medications, past family history, past medical history, past social history, past surgical history and problem list. Problem list updated.   Objective  Blood pressure 124/70, weight 180 lb (81.6 kg), last menstrual period 01/12/2017. Pregravid weight 152 lb (68.9 kg) Total Weight Gain 28 lb (12.7 kg) Urinalysis: Urine Protein: Negative Urine Glucose: Negative  Fetal Status: Fetal Heart Rate (bpm): 130 Fundal Height: 37 cm Movement: Present  Presentation: Vertex  General:  Alert, oriented and cooperative. Patient is in no acute distress.  Skin: Skin is warm and dry. No rash noted.   Cardiovascular: Normal heart rate noted  Respiratory: Normal respiratory effort, no problems with respiration noted  Abdomen: Soft, gravid, appropriate for gestational age. Pain/Pressure: Absent     Pelvic:  Cervical exam performed Dilation: Closed Effacement (%): 50 Station: -2  Extremities: Normal range of motion.     ental Status: Normal mood and affect. Normal behavior. Normal judgment and thought content.     Assessment   31 y.o. [name] at 7159w0d by   10/19/2017, by Last Menstrual Period presenting for routine prenatal visit  Plan   pregnancy 3 Problems (from 01/12/17 to present)    Problem Noted Resolved   Anemia complicating pregnancy 07/27/2017 by [name], [name], MD No   Overview Signed 07/27/2017  4:24 PM by [name], Tahjir Silveria, MD    Started iron on 07/27/2017      Supervision of high risk pregnancy, antepartum 03/19/2017 by [name], [name], MD No   Overview Addendum 09/27/2017  1:13 PM by [name], [name], MD    [facility] Prenatal Labs  Dating LMP = 12 week US Blood type: O/Positive/-- (05/25 1036)   Genetic Screen Declines Antibody:Negative (05/25 1036)  Anatomic US Normal Female Rubella: 1.68 (05/25 1036) Varicella: Immune  GTT Third trimester: 134  RPR: Non Reactive (05/25 1036)   Rhogam N/A HBsAg: Negative (05/25 1036)   TDaP vaccine 08/11/17  Flu Shot: 08/11/17 HIV: Non Reactive (05/25 1036)   Baby Food                                [name]:Negative  Contraception  Pap: 03/24/2017 NIL HPV negative  CBB     CS/VBAC Desires TOLAC             Term labor symptoms and general obstetric precautions including but not limited to vaginal bleeding, contractions, leaking of fluid and fetal movement were reviewed in detail with the patient. Please refer to After Visit Summary for other counseling recommendations.  - worsening lumbago out of work, supportive measures discussed - C-section 10-22-17 if not in labor Return in about 1 week (around 10/05/2017) for ROB Denise Houston.

## 2017-10-05 ENCOUNTER — Ambulatory Visit (INDEPENDENT_AMBULATORY_CARE_PROVIDER_SITE_OTHER): Payer: 59 | Admitting: Obstetrics and Gynecology

## 2017-10-05 VITALS — BP 122/74 | Wt 182.0 lb

## 2017-10-05 DIAGNOSIS — G8929 Other chronic pain: Secondary | ICD-10-CM

## 2017-10-05 DIAGNOSIS — Z3A38 38 weeks gestation of pregnancy: Secondary | ICD-10-CM

## 2017-10-05 DIAGNOSIS — O0990 Supervision of high risk pregnancy, unspecified, unspecified trimester: Secondary | ICD-10-CM

## 2017-10-05 DIAGNOSIS — Z98891 History of uterine scar from previous surgery: Secondary | ICD-10-CM

## 2017-10-05 DIAGNOSIS — M545 Low back pain, unspecified: Secondary | ICD-10-CM

## 2017-10-05 DIAGNOSIS — O99013 Anemia complicating pregnancy, third trimester: Secondary | ICD-10-CM

## 2017-10-05 NOTE — Progress Notes (Signed)
    Routine Prenatal Care Visit  Subjective  Denise Houston is a 31 y.o. [id number] at 4986w0d being seen today for ongoing prenatal care.  She is currently monitored for the following issues for this high-risk pregnancy and has Supervision of high risk pregnancy, antepartum; History of cesarean delivery; Chronic low back pain without sciatica; and Anemia complicating pregnancy on their problem list.  ----------------------------------------------------------------------------------- Patient reports no complaints.   Contractions: Irregular. Vag. Bleeding: None.  Movement: Present. Denies leaking of fluid.  ----------------------------------------------------------------------------------- The following portions of the patient's history were reviewed and updated as appropriate: allergies, current medications, past family history, past medical history, past social history, past surgical history and problem list. Problem list updated.   Objective  Blood pressure 122/74, weight 182 lb (82.6 kg), last menstrual period 01/12/2017. Pregravid weight 152 lb (68.9 kg) Total Weight Gain 30 lb (13.6 kg) Urinalysis: Urine Protein: Negative Urine Glucose: Negative  Fetal Status: Fetal Heart Rate (bpm): 135 Fundal Height: 38 cm Movement: Present  Presentation: Vertex  General:  Alert, oriented and cooperative. Patient is in no acute distress.  Skin: Skin is warm and dry. No rash noted.   Cardiovascular: Normal heart rate noted  Respiratory: Normal respiratory effort, no problems with respiration noted  Abdomen: Soft, gravid, appropriate for gestational age. Pain/Pressure: Absent     Pelvic:  Cervical exam deferred        Extremities: Normal range of motion.     ental Status: Normal mood and affect. Normal behavior. Normal judgment and thought content.     Assessment   31 y.o. [name] at 3286w0d by  10/19/2017, by Last Menstrual Period presenting for routine prenatal visit  Plan   pregnancy 3 Problems  (from 01/12/17 to present)    Problem Noted Resolved   Anemia complicating pregnancy 07/27/2017 by [name], Future Yeldell, MD No   Overview Signed 07/27/2017  4:24 PM by [name], Haruto Demaria, MD    Started iron on 07/27/2017      Supervision of high risk pregnancy, antepartum 03/19/2017 by [name], [name], MD No   Overview Addendum 09/27/2017  1:13 PM by [name], Seleste Tallman, MD    [facility]inic [facility] Prenatal Labs  Dating LMP = 12 week US Blood type: O/Positive/-- (05/25 1036)   Genetic Screen Declines Antibody:Negative (05/25 1036)  Anatomic US Normal Female Rubella: 1.68 (05/25 1036) Varicella: Immune  GTT Third trimester: 134  RPR: Non Reactive (05/25 1036)   Rhogam N/A HBsAg: Negative (05/25 1036)   TDaP vaccine 08/11/17  Flu Shot: 08/11/17 HIV: Non Reactive (05/25 1036)   Baby Food                                [name]:Negative  Contraception  Pap: 03/24/2017 NIL HPV negative  CBB     CS/VBAC Desires TOLAC             Term labor symptoms and general obstetric precautions including but not limited to vaginal bleeding, contractions, leaking of fluid and fetal movement were reviewed in detail with the patient. Please refer to After Visit Summary for other counseling recommendations.   Return in about 1 week (around 10/12/2017).

## 2017-10-05 NOTE — Progress Notes (Signed)
No vb. No lof.  

## 2017-10-11 ENCOUNTER — Ambulatory Visit (INDEPENDENT_AMBULATORY_CARE_PROVIDER_SITE_OTHER): Payer: Medicaid Other | Admitting: Obstetrics and Gynecology

## 2017-10-11 VITALS — BP 120/70 | Wt 182.0 lb

## 2017-10-11 DIAGNOSIS — O99013 Anemia complicating pregnancy, third trimester: Secondary | ICD-10-CM

## 2017-10-11 DIAGNOSIS — Z3A38 38 weeks gestation of pregnancy: Secondary | ICD-10-CM

## 2017-10-11 DIAGNOSIS — Z98891 History of uterine scar from previous surgery: Secondary | ICD-10-CM

## 2017-10-11 DIAGNOSIS — O0990 Supervision of high risk pregnancy, unspecified, unspecified trimester: Secondary | ICD-10-CM

## 2017-10-11 NOTE — Progress Notes (Signed)
    Routine Prenatal Care Visit  Subjective  Denise Houston is a 31 y.o. [id number] at 2656w6d being seen today for ongoing prenatal care.  She is currently monitored for the following issues for this high-risk pregnancy and has Supervision of high risk pregnancy, antepartum; History of cesarean delivery; Chronic low back pain without sciatica; and Anemia complicating pregnancy on their problem list.  ----------------------------------------------------------------------------------- Patient reports no complaints.   Contractions: Irregular. Vag. Bleeding: None.  Movement: Present. Denies leaking of fluid.     Objective   Vitals:   10/11/17 1021  BP: 120/70  Weight: 182 lb (82.6 kg)   Pregravid Weight: 152 lb (68.9 kg)  Total Weight Gain: 30 lb (13.6 kg)  Urinalysis: Urine Protein: Trace Urine Glucose: Negative  Fetal Status: Fetal Heart Rate (bpm): 140 Fundal Height: 39 cm Movement: Present     General: Alert: Oriented and cooperative. Patient is in no acute distress. Skin: Skin is warm and dry. No rash noted.  Cardiovascular: Regular rate and rhythm. No murmurs, gallops, or rubs. Respiratory: Normal respiratory effort, no problems with respiration noted Abdomen: Soft, gravid, appropriate for gestational age. Pain/Pressure: Absent Pelvic: Cervical exam deferred       Extremeties: Normal range of motion.    Mental Status: Normal mood and affect. Normal behavior. Normal judgment and thought content.  Assessment   31 y.o. [name] at 6356w6d by  10/19/2017, by Last Menstrual Period presenting for routine prenatal visit  Plan   pregnancy 3 Problems (from 01/12/17 to present)    Problem Noted Resolved   Anemia complicating pregnancy 07/27/2017 by [name], Andreas, MD No   Overview Signed 07/27/2017  4:24 PM by [name], Andreas, MD    Started iron on 07/27/2017      Supervision of high risk pregnancy, antepartum 03/19/2017 by [name], [name], MD No   Overview Addendum 10/11/2017  10:51 AM by Natale MilchSchuman, Uldine Fuster R, MD    [facility]inic [facility] Prenatal Labs  Dating LMP = 12 week US Blood type: O/Positive/-- (05/25 1036)   Genetic Screen Declines Antibody:Negative (05/25 1036)  Anatomic US Normal Female Rubella: 1.68 (05/25 1036) Varicella: Immune  GTT Third trimester: 134  RPR: Non Reactive (05/25 1036)   Rhogam N/A HBsAg: Negative (05/25 1036)   TDaP vaccine 08/11/17  Flu Shot: 08/11/17 HIV: Non Reactive (05/25 1036)   Baby Food   Bottle                             [name]:Negative  Contraception  Tubal Pap: 03/24/2017 NIL HPV negative  CBB     CS/VBAC Desires c/s             Term labor symptoms and general obstetric precautions including but not limited to vaginal bleeding, contractions, leaking of fluid and fetal movement were reviewed in detail with the patient.  Patient scheduled for RLTCS and BTL in two days.  Please refer to After Visit Summary for other counseling recommendations.   Return in about 2 weeks (around 10/25/2017) for post op visit.  Denise Houston M.D. 10/11/17 11:02 AM

## 2017-10-11 NOTE — Progress Notes (Signed)
Pt reports no problems. C-section 10/13/17 Dr. Bonney AidStaebler.

## 2017-10-12 ENCOUNTER — Other Ambulatory Visit: Payer: Self-pay

## 2017-10-12 ENCOUNTER — Encounter
Admission: RE | Admit: 2017-10-12 | Discharge: 2017-10-12 | Disposition: A | Discharge status: Home / Self Care | Payer: Medicaid Other | Source: Ambulatory Visit | Attending: Obstetrics and Gynecology | Admitting: Obstetrics and Gynecology

## 2017-10-12 DIAGNOSIS — Z01812 Encounter for preprocedural laboratory examination: Secondary | ICD-10-CM | POA: Insufficient documentation

## 2017-10-12 HISTORY — DX: Low back pain, unspecified: M54.50

## 2017-10-12 HISTORY — DX: Gastro-esophageal reflux disease without esophagitis: K21.9

## 2017-10-12 HISTORY — DX: Anemia, unspecified: D64.9

## 2017-10-12 HISTORY — DX: Other chronic pain: G89.29

## 2017-10-12 HISTORY — DX: Low back pain: M54.5

## 2017-10-12 LAB — CBC
HCT: 34.4 % — ABNORMAL LOW (ref 35.0–47.0)
HEMOGLOBIN: 11.3 g/dL — AB (ref 12.0–16.0)
MCH: 30.2 pg (ref 26.0–34.0)
MCHC: 32.9 g/dL (ref 32.0–36.0)
MCV: 91.9 fL (ref 80.0–100.0)
PLATELETS: 208 10*3/uL (ref 150–440)
RBC: 3.74 MIL/uL — ABNORMAL LOW (ref 3.80–5.20)
RDW: 13.9 % (ref 11.5–14.5)
WBC: 9.4 10*3/uL (ref 3.6–11.0)

## 2017-10-12 LAB — TYPE AND SCREEN
ABO/RH(D): O POS
ANTIBODY SCREEN: NEGATIVE
Extend sample reason: UNDETERMINED

## 2017-10-12 NOTE — Patient Instructions (Signed)
Your procedure is scheduled on: 10/13/17 Arrival @ 10 am Report to registiration on 1st floor medical mall Remember: Instructions that are not followed completely may result in serious medical risk, up to and including death, or upon the discretion of your surgeon and anesthesiologist your surgery may need to be rescheduled.    _x___ 1. Do not eat food after midnight the night before your procedure. You may drink clear liquids up to 2 hours before you are scheduled to arrive at the hospital for your procedure.  Do not drink clear liquids within 2 hours of your scheduled arrival to the hospital.  Clear liquids include  --Water or Apple juice without pulp  --Clear carbohydrate beverage such as ClearFast or Gatorade  --Black Coffee or Clear Tea (No milk, no creamers, do not add anything to                  the coffee or Tea Type 1 and type 2 diabetics should only drink water.  No gum chewing or hard candies.     __x__ 2. No Alcohol for 24 hours before or after surgery.   __x__3. No Smoking for 24 prior to surgery.   ____  4. Bring all medications with you on the day of surgery if instructed.    __x__ 5. Notify your doctor if there is any change in your medical condition     (cold, fever, infections).     Do not wear jewelry, make-up, hairpins, clips or nail polish.  Do not wear lotions, powders, or perfumes. You may wear deodorant.  Do not shave 48 hours prior to surgery. Men may shave face and neck.  Do not bring valuables to the hospital.    [facility] for any belongings or valuables.               Contacts, dentures or bridgework may not be worn into surgery.  Leave your suitcase in the car. After surgery it may be brought to your room.  For patients admitted to the hospital, discharge time is determined by your                       treatment team.   Patients discharged the day of surgery will not be allowed to drive home.  You will need someone to drive you  home and stay with you the night of your procedure.    Please read over the following fact sheets that you were given:   [facility] Preparing [facility] and or MRSA Information   _x___ Take anti-hypertensive listed below, cardiac, seizure, asthma,     anti-reflux and psychiatric medicines. These include:  1. ranitidine (ZANTAC) 150 MG tablet  2.  3.  4.  5.  6.  ____Fleets enema or Magnesium Citrate as directed.   _x___ Use CHG Soap or sage wipes as directed on instruction sheet   ____ Use inhalers on the day of surgery and bring to hospital day of surgery  ____ Stop Metformin and Janumet 2 days prior to surgery.    ____ Take 1/2 of usual insulin dose the night before surgery and none on the morning     surgery.   _x___ Follow recommendations from Cardiologist, Pulmonologist or PCP regarding          stopping Aspirin, Coumadin, Plavix ,Eliquis, Effient, or Pradaxa, and Pletal.  X____Stop Anti-inflammatories such as Advil, Aleve, Ibuprofen, Motrin, Naproxen, Naprosyn, Goodies powders or aspirin products. OK  to take Tylenol and                          Celebrex.   _x___ Stop supplements until after surgery.  But may continue Vitamin D, Vitamin B,       and multivitamin.   ____ Bring C-Pap to the hospital.

## 2017-10-13 ENCOUNTER — Encounter: Payer: Self-pay | Admitting: Certified Registered Nurse Anesthetist

## 2017-10-13 ENCOUNTER — Inpatient Hospital Stay: Payer: Medicaid Other | Admitting: Certified Registered Nurse Anesthetist

## 2017-10-13 ENCOUNTER — Other Ambulatory Visit: Payer: Self-pay

## 2017-10-13 ENCOUNTER — Inpatient Hospital Stay
Admission: RE | Admit: 2017-10-13 | Discharge: 2017-10-15 | DRG: 784 | Disposition: A | Discharge status: Home / Self Care | Payer: Medicaid Other | Source: Ambulatory Visit | Attending: Obstetrics and Gynecology | Admitting: Obstetrics and Gynecology

## 2017-10-13 ENCOUNTER — Encounter
Admission: RE | Disposition: A | Discharge status: Home / Self Care | Payer: Self-pay | Source: Ambulatory Visit | Attending: Obstetrics and Gynecology

## 2017-10-13 DIAGNOSIS — O9962 Diseases of the digestive system complicating childbirth: Secondary | ICD-10-CM | POA: Diagnosis present

## 2017-10-13 DIAGNOSIS — O9081 Anemia of the puerperium: Secondary | ICD-10-CM | POA: Diagnosis not present

## 2017-10-13 DIAGNOSIS — Z98891 History of uterine scar from previous surgery: Secondary | ICD-10-CM

## 2017-10-13 DIAGNOSIS — Z87891 Personal history of nicotine dependence: Secondary | ICD-10-CM

## 2017-10-13 DIAGNOSIS — Z302 Encounter for sterilization: Secondary | ICD-10-CM

## 2017-10-13 DIAGNOSIS — O99019 Anemia complicating pregnancy, unspecified trimester: Secondary | ICD-10-CM | POA: Diagnosis present

## 2017-10-13 DIAGNOSIS — O0990 Supervision of high risk pregnancy, unspecified, unspecified trimester: Secondary | ICD-10-CM

## 2017-10-13 DIAGNOSIS — O99013 Anemia complicating pregnancy, third trimester: Secondary | ICD-10-CM

## 2017-10-13 DIAGNOSIS — Z3A39 39 weeks gestation of pregnancy: Secondary | ICD-10-CM

## 2017-10-13 DIAGNOSIS — O34211 Maternal care for low transverse scar from previous cesarean delivery: Secondary | ICD-10-CM | POA: Diagnosis present

## 2017-10-13 DIAGNOSIS — D62 Acute posthemorrhagic anemia: Secondary | ICD-10-CM | POA: Diagnosis not present

## 2017-10-13 DIAGNOSIS — K219 Gastro-esophageal reflux disease without esophagitis: Secondary | ICD-10-CM | POA: Diagnosis present

## 2017-10-13 LAB — RPR: RPR Ser Ql: NONREACTIVE

## 2017-10-13 LAB — ABO/RH: ABO/RH(D): O POS

## 2017-10-13 SURGERY — Surgical Case
Anesthesia: Spinal | Wound class: Clean Contaminated

## 2017-10-13 MED ORDER — SERTRALINE HCL 25 MG PO TABS
50.0000 mg | ORAL_TABLET | Freq: Every day | ORAL | Status: DC
  Administered 2017-10-13 – 2017-10-15 (×2): 50 mg via ORAL
  Filled 2017-10-13 (×2): qty 2

## 2017-10-13 MED ORDER — FENTANYL CITRATE (PF) 100 MCG/2ML IJ SOLN: 25.0000 ug | INTRAMUSCULAR | Status: DC | PRN

## 2017-10-13 MED ORDER — EPHEDRINE SULFATE-NACL 50-0.9 MG/10ML-% IV SOSY
PREFILLED_SYRINGE | INTRAVENOUS | Status: DC | PRN
  Administered 2017-10-13: 10 mg via INTRAVENOUS

## 2017-10-13 MED ORDER — OXYCODONE-ACETAMINOPHEN 5-325 MG PO TABS
2.0000 | ORAL_TABLET | ORAL | Status: DC | PRN
  Administered 2017-10-13 – 2017-10-15 (×5): 2 via ORAL
  Filled 2017-10-13 (×5): qty 2

## 2017-10-13 MED ORDER — LACTATED RINGERS IV SOLN
INTRAVENOUS | Status: DC
Start: 2017-10-13 — End: 2017-10-13
  Administered 2017-10-13: 1000 mL via INTRAVENOUS

## 2017-10-13 MED ORDER — DIBUCAINE 1 % RE OINT: 1.0000 "application " | TOPICAL_OINTMENT | RECTAL | Status: DC | PRN

## 2017-10-13 MED ORDER — EPHEDRINE SULFATE 50 MG/ML IJ SOLN
INTRAMUSCULAR | Status: AC
  Filled 2017-10-13: qty 1

## 2017-10-13 MED ORDER — KETOROLAC TROMETHAMINE 30 MG/ML IJ SOLN
30.0000 mg | Freq: Four times a day (QID) | INTRAMUSCULAR | Status: DC | PRN
  Administered 2017-10-13: 30 mg via INTRAVENOUS

## 2017-10-13 MED ORDER — PRENATAL MULTIVITAMIN CH
1.0000 | ORAL_TABLET | Freq: Every day | ORAL | Status: DC
  Administered 2017-10-14 – 2017-10-15 (×2): 1 via ORAL
  Filled 2017-10-13 (×2): qty 1

## 2017-10-13 MED ORDER — DIPHENHYDRAMINE HCL 25 MG PO CAPS: 25.0000 mg | ORAL_CAPSULE | Freq: Four times a day (QID) | ORAL | Status: DC | PRN

## 2017-10-13 MED ORDER — PROPOFOL 10 MG/ML IV BOLUS
INTRAVENOUS | Status: AC
  Filled 2017-10-13: qty 20

## 2017-10-13 MED ORDER — BUPIVACAINE HCL 0.5 % IJ SOLN
50.0000 mL | INTRAMUSCULAR | Status: DC
  Filled 2017-10-13: qty 50

## 2017-10-13 MED ORDER — BUPIVACAINE HCL (PF) 0.5 % IJ SOLN
INTRAMUSCULAR | Status: DC | PRN
  Administered 2017-10-13: 8 mL

## 2017-10-13 MED ORDER — SODIUM CHLORIDE 0.9 % IV SOLN
INTRAVENOUS | Status: DC | PRN
  Administered 2017-10-13: 15 ug/min via INTRAVENOUS

## 2017-10-13 MED ORDER — SENNOSIDES-DOCUSATE SODIUM 8.6-50 MG PO TABS
2.0000 | ORAL_TABLET | ORAL | Status: DC
  Administered 2017-10-13 – 2017-10-15 (×2): 2 via ORAL
  Filled 2017-10-13 (×2): qty 2

## 2017-10-13 MED ORDER — BUPIVACAINE IN DEXTROSE 0.75-8.25 % IT SOLN
INTRATHECAL | Status: AC
  Filled 2017-10-13: qty 2

## 2017-10-13 MED ORDER — BUPIVACAINE IN DEXTROSE 0.75-8.25 % IT SOLN
INTRATHECAL | Status: DC | PRN
  Administered 2017-10-13: 1.8 mL via INTRATHECAL

## 2017-10-13 MED ORDER — IBUPROFEN 600 MG PO TABS
600.0000 mg | ORAL_TABLET | Freq: Four times a day (QID) | ORAL | Status: DC
  Administered 2017-10-13 – 2017-10-15 (×7): 600 mg via ORAL
  Filled 2017-10-13 (×7): qty 1

## 2017-10-13 MED ORDER — COCONUT OIL OIL: 1.0000 "application " | TOPICAL_OIL | Status: DC | PRN

## 2017-10-13 MED ORDER — SIMETHICONE 80 MG PO CHEW
80.0000 mg | CHEWABLE_TABLET | Freq: Three times a day (TID) | ORAL | Status: DC
  Administered 2017-10-14 – 2017-10-15 (×5): 80 mg via ORAL
  Filled 2017-10-13 (×5): qty 1

## 2017-10-13 MED ORDER — SIMETHICONE 80 MG PO CHEW: 80.0000 mg | CHEWABLE_TABLET | ORAL | Status: DC | PRN

## 2017-10-13 MED ORDER — PHENYLEPHRINE HCL 10 MG/ML IJ SOLN
INTRAMUSCULAR | Status: DC | PRN
  Administered 2017-10-13: 100 ug via INTRAVENOUS

## 2017-10-13 MED ORDER — MORPHINE SULFATE (PF) 0.5 MG/ML IJ SOLN
INTRAMUSCULAR | Status: AC
  Filled 2017-10-13: qty 10

## 2017-10-13 MED ORDER — PHENYLEPHRINE HCL 10 MG/ML IJ SOLN
INTRAMUSCULAR | Status: AC
  Filled 2017-10-13: qty 1

## 2017-10-13 MED ORDER — LACTATED RINGERS IV SOLN
INTRAVENOUS | Status: DC
  Administered 2017-10-13 (×2): via INTRAVENOUS

## 2017-10-13 MED ORDER — ONDANSETRON HCL 4 MG/2ML IJ SOLN: 4.0000 mg | Freq: Once | INTRAMUSCULAR | Status: DC | PRN

## 2017-10-13 MED ORDER — GLYCOPYRROLATE 0.2 MG/ML IJ SOLN
INTRAMUSCULAR | Status: DC | PRN
  Administered 2017-10-13: 0.2 mg via INTRAVENOUS

## 2017-10-13 MED ORDER — SOD CITRATE-CITRIC ACID 500-334 MG/5ML PO SOLN
30.0000 mL | ORAL | Status: AC
  Administered 2017-10-13: 30 mL via ORAL
  Filled 2017-10-13: qty 15

## 2017-10-13 MED ORDER — MENTHOL 3 MG MT LOZG
1.0000 | LOZENGE | OROMUCOSAL | Status: DC | PRN
  Filled 2017-10-13: qty 9

## 2017-10-13 MED ORDER — KETOROLAC TROMETHAMINE 30 MG/ML IJ SOLN
INTRAMUSCULAR | Status: AC
  Filled 2017-10-13: qty 1

## 2017-10-13 MED ORDER — BUPIVACAINE HCL (PF) 0.5 % IJ SOLN
INTRAMUSCULAR | Status: AC
  Filled 2017-10-13: qty 30

## 2017-10-13 MED ORDER — OXYTOCIN 40 UNITS IN LACTATED RINGERS INFUSION - SIMPLE MED
INTRAVENOUS | Status: DC | PRN
  Administered 2017-10-13: 0.04 [IU] via INTRAVENOUS
  Administered 2017-10-13: 490 mL via INTRAVENOUS
  Administered 2017-10-13: 10 mL via INTRAVENOUS

## 2017-10-13 MED ORDER — WITCH HAZEL-GLYCERIN EX PADS: 1.0000 "application " | MEDICATED_PAD | CUTANEOUS | Status: DC | PRN

## 2017-10-13 MED ORDER — OXYTOCIN 40 UNITS IN LACTATED RINGERS INFUSION - SIMPLE MED
INTRAVENOUS | Status: AC
  Filled 2017-10-13: qty 1000

## 2017-10-13 MED ORDER — DEXTROSE 5 % IV SOLN
2.0000 g | INTRAVENOUS | Status: AC
  Administered 2017-10-13: 2 g via INTRAVENOUS
  Filled 2017-10-13: qty 20

## 2017-10-13 MED ORDER — ACETAMINOPHEN 325 MG PO TABS: 650.0000 mg | ORAL_TABLET | ORAL | Status: DC | PRN

## 2017-10-13 MED ORDER — OXYTOCIN 40 UNITS IN LACTATED RINGERS INFUSION - SIMPLE MED
2.5000 [IU]/h | INTRAVENOUS | Status: AC
  Filled 2017-10-13: qty 1000

## 2017-10-13 MED ORDER — ATROPINE SULFATE 1 MG/10ML IJ SOSY
PREFILLED_SYRINGE | INTRAMUSCULAR | Status: AC
  Filled 2017-10-13: qty 10

## 2017-10-13 MED ORDER — OXYCODONE-ACETAMINOPHEN 5-325 MG PO TABS
1.0000 | ORAL_TABLET | ORAL | Status: DC | PRN
  Administered 2017-10-14 – 2017-10-15 (×3): 1 via ORAL
  Filled 2017-10-13 (×4): qty 1

## 2017-10-13 MED ORDER — MORPHINE SULFATE (PF) 0.5 MG/ML IJ SOLN
INTRAMUSCULAR | Status: DC | PRN
  Administered 2017-10-13: .2 mg via EPIDURAL

## 2017-10-13 MED ORDER — BUPIVACAINE ON-Q PAIN PUMP (FOR ORDER SET NO CHG)
INJECTION | Status: DC
  Filled 2017-10-13: qty 1

## 2017-10-13 MED ORDER — LACTATED RINGERS IV SOLN
INTRAVENOUS | Status: DC
  Administered 2017-10-13 – 2017-10-14 (×2): via INTRAVENOUS

## 2017-10-13 MED ORDER — GLYCOPYRROLATE 0.2 MG/ML IJ SOLN
INTRAMUSCULAR | Status: AC
  Filled 2017-10-13: qty 1

## 2017-10-13 MED ORDER — ONDANSETRON HCL 4 MG/2ML IJ SOLN
INTRAMUSCULAR | Status: DC | PRN
  Administered 2017-10-13: 4 mg via INTRAVENOUS

## 2017-10-13 MED ORDER — SIMETHICONE 80 MG PO CHEW
80.0000 mg | CHEWABLE_TABLET | ORAL | Status: DC
  Administered 2017-10-13 – 2017-10-15 (×2): 80 mg via ORAL
  Filled 2017-10-13 (×2): qty 1

## 2017-10-13 MED ORDER — BUPIVACAINE 0.25 % ON-Q PUMP DUAL CATH 400 ML
400.0000 mL | INJECTION | Status: DC
  Filled 2017-10-13: qty 400

## 2017-10-13 SURGICAL SUPPLY — 28 items
BAG COUNTER SPONGE EZ (MISCELLANEOUS) ×3 IMPLANT
CANISTER SUCT 3000ML PPV (MISCELLANEOUS) ×4 IMPLANT
CATH KIT ON-Q SILVERSOAK 5IN (CATHETERS) ×8 IMPLANT
CHLORAPREP W/TINT 26ML (MISCELLANEOUS) ×8 IMPLANT
CLOSURE WOUND 1/2 X4 (GAUZE/BANDAGES/DRESSINGS) ×1
COUNTER SPONGE BAG EZ (MISCELLANEOUS) ×1
DERMABOND ADVANCED (GAUZE/BANDAGES/DRESSINGS) ×2
DERMABOND ADVANCED .7 DNX12 (GAUZE/BANDAGES/DRESSINGS) ×2 IMPLANT
DRSG OPSITE POSTOP 4X10 (GAUZE/BANDAGES/DRESSINGS) ×4 IMPLANT
DRSG TELFA 3X8 NADH (GAUZE/BANDAGES/DRESSINGS) ×4 IMPLANT
ELECT CAUTERY BLADE 6.4 (BLADE) ×4 IMPLANT
ELECT REM PT RETURN 9FT ADLT (ELECTROSURGICAL) ×4
ELECTRODE REM PT RTRN 9FT ADLT (ELECTROSURGICAL) ×2 IMPLANT
GAUZE SPONGE 4X4 12PLY STRL (GAUZE/BANDAGES/DRESSINGS) ×4 IMPLANT
GLOVE BIO SURGEON STRL SZ7 (GLOVE) ×4 IMPLANT
GLOVE INDICATOR 7.5 STRL GRN (GLOVE) ×4 IMPLANT
GOWN STRL REUS W/ TWL LRG LVL3 (GOWN DISPOSABLE) ×6 IMPLANT
GOWN STRL REUS W/TWL LRG LVL3 (GOWN DISPOSABLE) ×6
NS IRRIG 1000ML POUR BTL (IV SOLUTION) ×4 IMPLANT
PACK C SECTION AR (MISCELLANEOUS) ×4 IMPLANT
PAD OB MATERNITY 4.3X12.25 (PERSONAL CARE ITEMS) ×4 IMPLANT
PAD PREP 24X41 OB/GYN DISP (PERSONAL CARE ITEMS) ×4 IMPLANT
STRIP CLOSURE SKIN 1/2X4 (GAUZE/BANDAGES/DRESSINGS) ×3 IMPLANT
SUT MNCRL AB 4-0 PS2 18 (SUTURE) ×4 IMPLANT
SUT PDS AB 1 TP1 96 (SUTURE) ×8 IMPLANT
SUT VIC AB 0 CTX 36 (SUTURE) ×4
SUT VIC AB 0 CTX36XBRD ANBCTRL (SUTURE) ×4 IMPLANT
SUT VIC AB 2-0 CT1 36 (SUTURE) ×4 IMPLANT

## 2017-10-13 NOTE — Anesthesia Preprocedure Evaluation (Signed)
Anesthesia Evaluation  Patient identified by MRN, date of birth, ID band Patient awake    Reviewed: Allergy & Precautions, NPO status , Patient's Chart, lab work & pertinent test results  Airway Mallampati: I       Dental  (+) Teeth Intact   Pulmonary neg pulmonary ROS, former smoker,    breath sounds clear to auscultation       Cardiovascular Exercise Tolerance: Good  Rhythm:Regular Rate:Normal     Neuro/Psych negative neurological ROS  negative psych ROS   GI/Hepatic Neg liver ROS, GERD  ,  Endo/Other  negative endocrine ROS  Renal/GU negative Renal ROS  negative genitourinary   Musculoskeletal negative musculoskeletal ROS (+)   Abdominal Normal abdominal exam  (+)   Peds negative pediatric ROS (+)  Hematology negative hematology ROS (+) anemia ,   Anesthesia Other Findings   Reproductive/Obstetrics                             Anesthesia Physical Anesthesia Plan  ASA: II  Anesthesia Plan: Spinal   Post-op Pain Management:    Induction:   PONV Risk Score and Plan:   Airway Management Planned: Natural Airway and Nasal Cannula  Additional Equipment:   Intra-op Plan:   Post-operative Plan:   Informed Consent: I have reviewed the patients History and Physical, chart, labs and discussed the procedure including the risks, benefits and alternatives for the proposed anesthesia with the patient or authorized representative who has indicated his/her understanding and acceptance.     Plan Discussed with: CRNA  Anesthesia Plan Comments:         Anesthesia Quick Evaluation

## 2017-10-13 NOTE — Progress Notes (Signed)
S/P 7 hours c/section  Subjective:   Called by RN to check incision dressing with red drainage from right side to center. Patient is doing well and her pain is well controlled.   Objective:  Blood pressure 113/69, pulse 70, temperature 98.9 F (37.2 C), temperature source Oral, resp. rate 18, height 5\' 7"  (1.702 m), weight 182 lb (82.6 kg), last menstrual period 01/12/2017, SpO2 97 %  General: NAD Pulmonary: no increased work of breathing Abdomen: non-distended, non-tender, fundus firm at level of umbilicus Incision: Honeycomb dressing is saturated on right half with sero sanguinous fluid. Dressing is removed and incision appears to be closed with no active drainage. Clean, dry honeycomb dressing is replaced.  Extremities: no edema, no erythema, no tenderness  Results for orders placed or performed during the hospital encounter of 10/13/17 (from the past 24 hour(s))  ABO/Rh     Status: None   Collection Time: 10/13/17 10:46 AM  Result Value Ref Range   ABO/RH(D) O POS     Intake/Output Summary (Last 24 hours) at 10/13/2017 2034 Last data filed at 10/13/2017 1904 Gross per 24 hour  Intake 1615 ml  Output 1873 ml  Net -258 ml     Assessment:   31 y.o. [id number] postoperativeday # 0   Plan:  1. Observe incision for further drainage  2. Continue routine post op care  Tresea MallJane Merlinda Wrubel, CNM

## 2017-10-13 NOTE — H&P (Signed)
Date of Initial H&P: 10/05/2017 History reviewed, patient examined, no change in status, stable for surgery. 

## 2017-10-13 NOTE — Op Note (Signed)
Preoperative Diagnosis: 1) 31 y.o. [id number] at 4467w1d 2) Desires permanent surgical sterilization 3) History of prior cesarean x 2  Postoperative Diagnosis: 1) 31 y.o. [id number] at 5967w1d 2) Desires permanent surgical sterilization 3) History of prior cesarean x 2  Operation Performed: Repeat low transverse C-section via pfannenstiel skin incision and Pomeroy tubal ligation  Anesthesia: Spinal  Primary Surgeon: Vena AustriaAndreas Fountain Derusha, MD  Assistant: Tresea MallJane Gledhill, CNM  Preoperative Antibiotics: 2g ancef  Estimated Blood Loss: 700 mL  IV Fluids: 1800mL  Drains or Tubes: Foley to gravity drainage, ON-Q catheter system  Implants: none  Specimens Removed: none  Complications: none  Intraoperative Findings:  Normal tubes ovaries and uterus.  Delivery resulted in the birth of a liveborn female, APGAR (1 MIN): 8   APGAR (5 MINS): 9 , weight 6lbs 15oz  Patient Condition: stable  Procedure in Detail:  Patient was taken to the operating room were she was administered regional anesthesia.  She was positioned in the supine position, prepped and draped in the  Usual sterile fashion.  Prior to proceeding with the case a time out was performed and the level of anesthetic was checked and noted to be adequate.  Utilizing the scalpel a pfannenstiel skin incision was made 2cm above the pubic symphysis utilizing the patient's pre-existing scar and carried down sharply to the the level of the rectus fascia.  The fascia was incised in the midline using the scalpel and then extended using mayo scissors.  The superior border of the rectus fascia was grasped with two Kocher clamps and the underlying rectus muscles were dissected of the fascia using blunt dissection.  The median raphae was incised using Mayo scissors.   The inferior border of the rectus fascia was dissected of the rectus muscles in a similar fashion.  The midline was identified, the peritoneum was entered bluntly and expanded using manual tractions.   The uterus was noted to be in a none rotated position.  Next the bladder blade was placed retracting the bladder caudally.  A bladder flap was not created.  A low transverse incision was scored on the lower uterine segment.  The hysterotomy was entered bluntly using the operators finger.  The hysterotomy incision was extended using manual traction.  The operators hand was placed within the hysterotomy position noting the fetus to be within the OA position.  The vertex was grasped, flexed, brought to the incision, and delivered a traumatically using fundal pressure.  The remainder of the body delivered with ease.  The infant was suctioned, cord was clamped and cut before handing off to the awaiting neonatologist.  The placenta was delivered using manual extraction.  The uterus was exteriorized, wiped clean of clots and debris using two moist laps.  The hysterotomy was closed using a two layer closure of 0 Vicryl, with the first being a running locked, the second a vertical imbricating.  The right tube was grasped in a mid isthmic portion using a Babcock clamp, before being double suture ligated using a 0 chromic wheel.  The intervening nuckel of tube was excised using Metzenbaum scissors.  Complete cross section of tubal ostia visualized, and noted to be hemostatic  This procedure was then repeated in similar fashion for the patient left tube.    The uterus was returned to the abdomen.  The peritoneal gutters were wiped clean of clots and debris using two moist laps.  The hysterotomy incision and tubal pedicles were re-inspected noted to be hemostatic.   The rectus muscles were  inspected noted to be hemostatic.  The superior border of the rectus fascia was grasped with a Kocher clamp.  The ON-Q trocars were then placed 4cm above the superior border of the incision and tunneled subfascially.  The introducers were removed and the catheters were threaded through the sleeves after which the sleeves were removed.  The  fascia was closed using a looped #1 PDS in a running fashion taking 1cm by 1cm bites.  The subcutaneous tissue was irrigated using warm saline, hemostasis achieved using the bovie.  The subcutaneous dead space was less than 3cm and was not closed.  The skin was closed using 4-0 monocryl in a subcuticular fashion.  Sponge needle and instrument counts were corrects times two.  The patient tolerated the procedure well and was taken to the recovery room in stable condition.

## 2017-10-13 NOTE — Transfer of Care (Signed)
Immediate Anesthesia Transfer of Care Note  Patient: Denise Houston  Procedure(s) Performed: CESAREAN SECTION WITH BILATERAL TUBAL LIGATION (Bilateral )  Patient Location: PACU and Mother/Baby  Anesthesia Type:Spinal  Level of Consciousness: awake and alert   Airway & Oxygen Therapy: Patient Spontanous Breathing  Post-op Assessment: Report given to RN and Post -op Vital signs reviewed and stable  Post vital signs: Reviewed and stable  Last Vitals:  Vitals:   10/13/17 1043                      10/13/17      1353  BP: 128/88                          108/69  Pulse: 89                                  68  Resp: 16                                  18  Temp: 36.8 C                          97.30F    Last Pain:  Vitals:   10/13/17 1100  TempSrc:   PainSc: 0-No pain         Complications: No apparent anesthesia complications

## 2017-10-13 NOTE — Anesthesia Procedure Notes (Signed)
Spinal  Patient location during procedure: OR Start time: 10/13/2017 12:30 PM End time: 10/13/2017 12:33 PM Staffing Anesthesiologist: Darleene CleaverVan Staveren, Gerrit HeckGijsbertus F, MD Performed: anesthesiologist  Preanesthetic Checklist Completed: patient identified, site marked, surgical consent, pre-op evaluation, IV checked, risks and benefits discussed and monitors and equipment checked Spinal Block Patient position: sitting Prep: Betadine Patient monitoring: heart rate, continuous pulse ox and blood pressure Approach: right paramedian Location: L3-4 Injection technique: single-shot Needle Needle type: Quincke  Needle gauge: 25 G Needle length: 9 cm Needle insertion depth: 6 cm Assessment Sensory level: T6

## 2017-10-13 NOTE — Anesthesia Post-op Follow-up Note (Signed)
Anesthesia QCDR form completed.        

## 2017-10-14 ENCOUNTER — Encounter: Payer: Self-pay | Admitting: Obstetrics and Gynecology

## 2017-10-14 LAB — GROUP A STREP BY PCR: GROUP A STREP BY PCR: NOT DETECTED

## 2017-10-14 LAB — CBC
HCT: 29.4 % — ABNORMAL LOW (ref 35.0–47.0)
HEMOGLOBIN: 9.8 g/dL — AB (ref 12.0–16.0)
MCH: 30.7 pg (ref 26.0–34.0)
MCHC: 33.5 g/dL (ref 32.0–36.0)
MCV: 91.6 fL (ref 80.0–100.0)
PLATELETS: 177 10*3/uL (ref 150–440)
RBC: 3.21 MIL/uL — ABNORMAL LOW (ref 3.80–5.20)
RDW: 13.7 % (ref 11.5–14.5)
WBC: 13.4 10*3/uL — ABNORMAL HIGH (ref 3.6–11.0)

## 2017-10-14 MED ORDER — AZITHROMYCIN 250 MG PO TABS
250.0000 mg | ORAL_TABLET | Freq: Every day | ORAL | Status: DC
  Administered 2017-10-15: 250 mg via ORAL
  Filled 2017-10-14: qty 1

## 2017-10-14 MED ORDER — AZITHROMYCIN 250 MG PO TABS
500.0000 mg | ORAL_TABLET | Freq: Every day | ORAL | Status: AC
  Administered 2017-10-14: 500 mg via ORAL
  Filled 2017-10-14: qty 2

## 2017-10-14 MED ORDER — FERROUS SULFATE 325 (65 FE) MG PO TABS
325.0000 mg | ORAL_TABLET | Freq: Two times a day (BID) | ORAL | Status: DC
  Administered 2017-10-14 – 2017-10-15 (×2): 325 mg via ORAL
  Filled 2017-10-14 (×2): qty 1

## 2017-10-14 MED ORDER — GUAIFENESIN 100 MG/5ML PO SOLN
5.0000 mL | ORAL | Status: DC | PRN
  Administered 2017-10-14 – 2017-10-15 (×4): 100 mg via ORAL
  Filled 2017-10-14 (×4): qty 10

## 2017-10-14 NOTE — Progress Notes (Addendum)
POD#1 rLTCS and BTL Subjective:  Well appearing, sitting up in bed. Has not yet ambulated. Tolerating regular diet. Pain control adequate.   Objective:  Blood pressure 112/68, pulse 70, temperature 98.5 F (36.9 C), temperature source Oral, resp. rate 18, height 5\' 7"  (1.702 m), weight 182 lb (82.6 kg), last menstrual period 01/12/2017, SpO2 99 %.  General: NAD Pulmonary: CTAB, no increased work of breathing Abdomen: non-distended, non-tender, fundus firm at U/1, lochia rubra small Incision: Dressing C/D/I and one OnQ in place and infusing Extremities: no edema, no erythema, no tenderness  Results for orders placed or performed during the hospital encounter of 10/13/17 (from the past 72 hour(s))  ABO/Rh     Status: None   Collection Time: 10/13/17 10:46 AM  Result Value Ref Range   ABO/RH(D) O POS   CBC     Status: Abnormal   Collection Time: 10/14/17  5:05 AM  Result Value Ref Range   WBC 13.4 (H) 3.6 - 11.0 K/uL   RBC 3.21 (L) 3.80 - 5.20 MIL/uL   Hemoglobin 9.8 (L) 12.0 - 16.0 g/dL   HCT 40.929.4 (L) 81.135.0 - 91.447.0 %   MCV 91.6 80.0 - 100.0 fL   MCH 30.7 26.0 - 34.0 pg   MCHC 33.5 32.0 - 36.0 g/dL   RDW 78.213.7 95.611.5 - 21.314.5 %   Platelets 177 150 - 440 K/uL     Assessment:   31 y.o. [id number] postoperative day # 1 in good condition.   Plan:  1) Acute blood loss anemia - hemodynamically stable and asymptomatic - PO ferrous sulfate  2) Blood Type --/--/O POS (12/19 1046) / Rubella 1.68 (05/25 1036) / Varicella immune  3) TDAP status: given with influenza vaccine on 08/11/2017  4) Formula feeding  5) Contraception: BTL  6) Disposition: continue postpartum care.  Marcelyn BruinsJacelyn Schmid, CNM 10/14/2017  9:12 AM

## 2017-10-14 NOTE — Anesthesia Postprocedure Evaluation (Signed)
Anesthesia Post Note  Patient: Denise Houston  Procedure(s) Performed: CESAREAN SECTION WITH BILATERAL TUBAL LIGATION (Bilateral )  Patient location during evaluation: Nursing Unit Anesthesia Type: Spinal Level of consciousness: awake, awake and alert and oriented Pain management: pain level controlled Vital Signs Assessment: post-procedure vital signs reviewed and stable Cardiovascular status: blood pressure returned to baseline Postop Assessment: no headache, no backache, no apparent nausea or vomiting and adequate PO intake Anesthetic complications: no     Last Vitals:  Vitals:   10/13/17 2314 10/14/17 0402  BP: (!) 101/59 110/74  Pulse: 83 92  Resp: 18 18  Temp: 37 C 36.9 C  SpO2: 99% 99%    Last Pain:  Vitals:   10/14/17 0520  TempSrc:   PainSc: 4                  Kiren Mcisaac Lawerance CruelStarr

## 2017-10-14 NOTE — Anesthesia Post-op Follow-up Note (Signed)
  Anesthesia Pain Follow-up Note  Patient: Christen BameBrandi L Leiva  Day #: 1  Date of Follow-up: 10/14/2017 Time: 7:24 AM  Last Vitals:  Vitals:   10/14/17 0402 10/14/17 0724  BP: 110/74 112/68  Pulse: 92 70  Resp: 18   Temp: 36.9 C 36.9 C  SpO2: 99% 99%    Level of Consciousness: alert  Pain: none   Side Effects:None  Catheter Site Exam:clean, dry, intact     Plan: D/C from anesthesia care at surgeon's request  Karoline Caldwelleana Arrick Dutton

## 2017-10-14 NOTE — Addendum Note (Signed)
Addendum  created 10/14/17 0725 by Karoline CaldwellStarr, Coleton Woon, CRNA   Sign clinical note

## 2017-10-15 LAB — SURGICAL PATHOLOGY

## 2017-10-15 MED ORDER — OXYCODONE-ACETAMINOPHEN 5-325 MG PO TABS: 1.0000 | ORAL_TABLET | ORAL | 0 refills | Status: DC | PRN

## 2017-10-15 MED ORDER — AZITHROMYCIN 250 MG PO TABS: ORAL_TABLET | ORAL | 0 refills | Status: DC

## 2017-10-15 NOTE — Progress Notes (Signed)
Patient discharged home with infant and family. Discharge instructions, prescriptions, hygiene kit and follow up appointment given to and reviewed with patient and family. Patient verbalized understanding. Escorted out via wheelchair by auxiliary.

## 2017-10-15 NOTE — Discharge Instructions (Signed)

## 2017-10-15 NOTE — Discharge Summary (Signed)
OB Discharge Summary     Patient Name: Denise Houston DOB: [birth year] MRN: [id number]  Date of admission: 10/13/2017 Delivering MD: Bonney AidStaebler  Date of Delivery: 10/13/2017  Date of discharge: 10/15/2017  Admitting diagnosis: REPEAT CESAREAN Intrauterine pregnancy: 4771w1d     Secondary diagnosis: None     Discharge diagnosis: Term Pregnancy Delivered, No other diagnosis                         Hospital course:  Sceduled C/S   31 y.o. yo [id number] at 6771w1d was admitted to the hospital 10/13/2017 for scheduled cesarean section with the following indication:Elective Repeat.  Membrane Rupture Time/Date: 12:51 PM ,10/13/2017   Patient delivered a Viable infant.10/13/2017  Details of operation can be found in separate operative note.  Pateint had an uncomplicated postpartum course.  She is ambulating, tolerating a regular diet, passing flatus, and urinating well. Patient is discharged home in stable condition on  10/15/17                                                                        Post partum procedures:none  Complications: None  Physical exam on 10/15/2017: Vitals:   10/14/17 0402 10/14/17 0724 10/14/17 1501 10/14/17 2345  BP: 110/74 112/68 118/72 116/74  Pulse: 92 70 76 75  Resp: 18 18 18 18   Temp: 98.5 F (36.9 C) 98.5 F (36.9 C) 98.2 F (36.8 C) 98.5 F (36.9 C)  TempSrc: Oral Oral Oral Oral  SpO2: 99% 99% 98% 97%  Weight:      Height:       General: alert, cooperative and no distress Lochia: appropriate Uterine Fundus: firm Incision: Healing well with no significant drainage DVT Evaluation: No evidence of DVT seen on physical exam.  Labs: Lab Results  Component Value Date   WBC 13.4 (H) 10/14/2017   HGB 9.8 ([place]) 10/14/2017   HCT 29.4 ([place]) 10/14/2017   MCV 91.6 10/14/2017   PLT 177 10/14/2017   CMP Latest Ref Rng & Units 11/18/2013  Glucose 65 - 99 mg/dL 88  BUN 7 - 18 mg/dL 8  Creatinine 1.470.60 - 8.291.30 mg/dL 5.62(Z0.55([place])  Sodium 308136 - 657145 mmol/[place] 135([place])   Potassium 3.5 - 5.1 mmol/[place] 3.5  Chloride 98 - 107 mmol/[place] 107  CO2 21 - 32 mmol/[place] 21  Calcium 8.5 - 10.1 mg/dL 8.5  Total Protein 6.4 - 8.2 g/dL 6.4  Total Bilirubin 0.2 - 1.0 mg/dL 8.4(O0.1([place])  Alkaline Phos Unit/[place] 97  AST 15 - 37 Unit/[place] 15  ALT 12 - 78 U/[place] 11([place])    Discharge instruction: per After Visit Summary.  Medications:  Allergies as of 10/15/2017   No Known Allergies     Medication List    STOP taking these medications   Butalbital-APAP-Caffeine 50-325-40 MG capsule     TAKE these medications   ferrous sulfate 325 (65 FE) MG tablet Commonly known as:  FERROUSUL Take 1 tablet (325 mg total) by mouth daily with breakfast.   multivitamin-prenatal 27-0.8 MG Tabs tablet Take 1 tablet by mouth daily.   oxyCODONE-acetaminophen 5-325 MG tablet Commonly known as:  PERCOCET/ROXICET Take 1 tablet by mouth every 4 (four) hours as needed (pain scale 4-7).  ranitidine 150 MG tablet Commonly known as:  ZANTAC Take 150 mg by mouth at bedtime.   sertraline 50 MG tablet Commonly known as:  ZOLOFT Take 1 tablet (50 mg total) by mouth daily. What changed:  when to take this   traZODone 50 MG tablet Commonly known as:  DESYREL Take 1 tablet (50 mg total) by mouth at bedtime as needed for sleep.     Colace for stool softener Iron pills and vitamins  Diet: routine diet  Activity: Advance as tolerated. Pelvic rest for 6 weeks.   Outpatient follow up: Follow-up Information    [name], Andreas, MD Follow up in 1 week(s).   Specialty:  Obstetrics and Gynecology Why:  For wound re-check Contact information: [address] [place] [address] [phone]             Postpartum contraception: Tubal Ligation Rhogam Given postpartum: no Rubella vaccine given postpartum: no Varicella vaccine given postpartum: no TDaP given antepartum or postpartum: Yes  Newborn Data: Live born female  Birth Weight: 6 lb 14.8 oz (3140 g) APGAR: 8, 9  Newborn Delivery    Birth date/time:  10/13/2017 12:52:00 Delivery type:  C-Section, Low Transverse C-section categorization:  Repeat      Baby Feeding: Bottle  Disposition:home with mother  SIGNED: Letitia Libraobert Paul Gavina Dildine, MD 10/15/2017 7:39 AM

## 2017-10-15 NOTE — Plan of Care (Signed)
Vs stable; tolerating regular diet; up ad lib; taking robutussin for cough and percocet and motrin for pain

## 2017-10-21 ENCOUNTER — Other Ambulatory Visit: Payer: 59

## 2017-10-21 ENCOUNTER — Encounter: Payer: 59 | Admitting: Obstetrics and Gynecology

## 2017-10-25 ENCOUNTER — Ambulatory Visit: Payer: Medicaid Other | Admitting: Obstetrics and Gynecology

## 2017-10-27 ENCOUNTER — Ambulatory Visit: Payer: Medicaid Other | Admitting: Obstetrics and Gynecology

## 2017-11-01 ENCOUNTER — Ambulatory Visit (INDEPENDENT_AMBULATORY_CARE_PROVIDER_SITE_OTHER): Payer: Medicaid Other | Admitting: Obstetrics and Gynecology

## 2017-11-01 ENCOUNTER — Encounter: Payer: Self-pay | Admitting: Obstetrics and Gynecology

## 2017-11-01 VITALS — BP 132/80 | HR 89 | Wt 178.0 lb

## 2017-11-01 DIAGNOSIS — Z4889 Encounter for other specified surgical aftercare: Secondary | ICD-10-CM

## 2017-11-01 NOTE — Progress Notes (Signed)
      Postoperative Follow-up Patient presents post op from repeat cesarean section & BTL 2weeks ago for history of prior C-section and undesired fertility.  Subjective: Patient reports marked improvement in her preop symptoms. Eating a regular diet without difficulty. The patient is not having any pain.  Activity: normal activities of daily living.  Objective: Vitals:   11/01/17 0840  BP: 132/80  Pulse: 89   Gen: NAD Abdomen: soft, non-tender, non-distended incision D/C/I healing well Ext no edema  Assessment: 32 y.o. s/p repeat Cesarean Section & BTL stable  Plan: Patient has done well after surgery with no apparent complications.  I have discussed the post-operative course to date, and the expected progress moving forward.  The patient understands what complications to be concerned about.  I will see the patient in routine follow up, or sooner if needed.    Activity plan: No heavy lifting.  Denise [place] 11/01/2017, 9:23 PM

## 2017-11-22 ENCOUNTER — Encounter: Payer: Self-pay | Admitting: Obstetrics and Gynecology

## 2017-11-22 ENCOUNTER — Ambulatory Visit (INDEPENDENT_AMBULATORY_CARE_PROVIDER_SITE_OTHER): Payer: Medicaid Other | Admitting: Obstetrics and Gynecology

## 2017-11-22 VITALS — BP 128/88 | HR 87 | Ht 67.0 in | Wt 174.0 lb

## 2017-11-22 DIAGNOSIS — F329 Major depressive disorder, single episode, unspecified: Secondary | ICD-10-CM

## 2017-11-22 DIAGNOSIS — Z4889 Encounter for other specified surgical aftercare: Secondary | ICD-10-CM

## 2017-11-22 MED ORDER — BUSPIRONE HCL 7.5 MG PO TABS: 7.5000 mg | ORAL_TABLET | Freq: Two times a day (BID) | ORAL | 3 refills | Status: DC

## 2017-11-22 MED ORDER — HYDROXYZINE HCL 25 MG PO TABS: 25.0000 mg | ORAL_TABLET | Freq: Four times a day (QID) | ORAL | 2 refills | Status: DC | PRN

## 2017-11-22 MED ORDER — SERTRALINE HCL 50 MG PO TABS: 50.0000 mg | ORAL_TABLET | Freq: Every day | ORAL | 11 refills | Status: DC

## 2017-11-22 NOTE — Progress Notes (Signed)
Postpartum Visit  Chief Complaint:  Chief Complaint  Patient presents with  . Postpartum Care    C/S 10/13/2017    History of Present Illness: Patient is a 32 y.o. [id number] presents for postpartum visit.   Review the Delivery Report for details.  Date of delivery: 10/13/2017 Cesarean Section: Elective repeat Pregnancy or labor problems:  no Any problems since the delivery:  no  Newborn Details:  SINGLETON :  1. Baby's gender: Female Birth weight: 6lbs 15oz Maternal Details:  Breast Feeding:  no Post partum depression/anxiety noted:  yes Edinburgh Post-Partum Depression Score:  11  Date of last PAP: 03/24/2017 NIL HPV negative  Review of Systems: ROS  The following portions of the patient's history were reviewed and updated as appropriate: allergies, current medications, past family history, past medical history, past social history, past surgical history and problem list.  Past Medical History:  Past Medical History:  Diagnosis Date  . Anemia   . Chronic lower back pain   . GERD (gastroesophageal reflux disease)     Past Surgical History:  Past Surgical History:  Procedure Laterality Date  . CESAREAN SECTION  2011  . CESAREAN SECTION  2015  . CESAREAN SECTION  2018  . CESAREAN SECTION WITH BILATERAL TUBAL LIGATION Bilateral 10/13/2017   Procedure: CESAREAN SECTION WITH BILATERAL TUBAL LIGATION;  Surgeon: [name], Valerie Fredin, MD;  Location: [facility] ORS;  Service: Obstetrics;  Laterality: Bilateral;  . DILATION AND CURETTAGE OF UTERUS  07/2010  . INNER EAR SURGERY Left 2003  . TONSILLECTOMY      Family History:  Family History  Problem Relation Age of Onset  . Diabetes Mother        Type 2  . Diabetes Father        Type 2  . Diabetes Maternal Grandmother        Type 2    Social History:  Social History   Socioeconomic History  . Marital status: Single    Spouse name: Not on file  . Number of children: Not on file  . Years of education: Not on file  .  Highest education level: Not on file  Social Needs  . Financial resource strain: Not on file  . Food insecurity - worry: Not on file  . Food insecurity - inability: Not on file  . Transportation needs - medical: Not on file  . Transportation needs - non-medical: Not on file  Occupational History  . Not on file  Tobacco Use  . Smoking status: Former Smoker    Last attempt to quit: 09/12/2017    Years since quitting: 0.1  . Smokeless tobacco: Never Used  Substance and Sexual Activity  . Alcohol use: No  . Drug use: No  . Sexual activity: No  Other Topics Concern  . Not on file  Social History Narrative  . Not on file    Allergies:  No Known Allergies  Medications: Prior to Admission medications   Medication Sig Start Date End Date Taking? Authorizing Provider  oxyCODONE-acetaminophen (PERCOCET/ROXICET) 5-325 MG tablet Take 1 tablet by mouth every 4 (four) hours as needed (pain scale 4-7). 10/15/17   [name], [name], MD  Prenatal Vit-Fe Fumarate-FA (MULTIVITAMIN-PRENATAL) 27-0.8 MG TABS tablet Take 1 tablet by mouth daily.     [provider]  sertraline (ZOLOFT) 50 MG tablet Take 1 tablet (50 mg total) by mouth daily. Patient taking differently: Take 50 mg by mouth at bedtime.  08/17/17   [name], Enslie Sahota, MD  traZODone (DESYREL) 50 MG tablet Take 1 tablet (50 mg total) by mouth at bedtime as needed for sleep. Patient not taking: Reported on 10/11/2017 07/21/17   Vena Austria, MD    Physical Exam Blood pressure 128/88, pulse 87, height 5\' 7"  (1.702 m), weight 174 lb (78.9 kg), not currently breastfeeding.  General: NAD HEENT: normocephalic, anicteric Pulmonary: No increased work of breathing Abdomen: NABS, soft, non-tender, non-distended.  Umbilicus without lesions.  No hepatomegaly, splenomegaly or masses palpable. No evidence of hernia. Incision D/C/I Genitourinary:  External: Normal external female genitalia.  Normal urethral meatus, normal Bartholin's  and Skene's glands.    Vagina: Normal vaginal mucosa, no evidence of prolapse.    Cervix: Grossly normal in appearance, no bleeding  Uterus: Non-enlarged, mobile, normal contour.  No CMT  Adnexa: ovaries non-enlarged, no adnexal masses  Rectal: deferred Extremities: no edema, erythema, or tenderness Neurologic: Grossly intact Psychiatric: mood appropriate, affect full  Assessment: 32 y.o. [id number] presenting for 6 week postpartum visit  Plan: Problem List Items Addressed This Visit    None    Visit Diagnoses    Postoperative visit    -  Primary   Reactive depression       Relevant Medications   sertraline (ZOLOFT) 50 MG tablet   busPIRone (BUSPAR) 7.5 MG tablet   hydrOXYzine (ATARAX/VISTARIL) 25 MG tablet   Encounter for postpartum visit         1) Contraception s/p BTL  2)  Pap - ASCCP guidelines and rational discussed.  Patient opts for 3 yearscreening interval  3) Patient underwent screening for postpartum depression currently on Zoloft 50mg  daily - EPDS 10 item 10 is 0, mostly anxiety - add buspar - prn vistaril  4) Follow up 1 year for routine annual exam, 6 weeks medication follow up

## 2018-01-03 ENCOUNTER — Ambulatory Visit: Payer: Self-pay | Admitting: Obstetrics and Gynecology

## 2019-06-30 ENCOUNTER — Encounter: Payer: Self-pay | Admitting: Podiatry

## 2019-06-30 ENCOUNTER — Ambulatory Visit: Payer: Self-pay

## 2019-06-30 ENCOUNTER — Ambulatory Visit: Payer: BC Managed Care – PPO | Admitting: Podiatry

## 2019-06-30 ENCOUNTER — Other Ambulatory Visit: Payer: Self-pay | Admitting: Internal Medicine

## 2019-06-30 ENCOUNTER — Other Ambulatory Visit: Payer: Self-pay

## 2019-06-30 VITALS — BP 113/77 | HR 94 | Resp 16

## 2019-06-30 DIAGNOSIS — B351 Tinea unguium: Secondary | ICD-10-CM

## 2019-06-30 DIAGNOSIS — R6889 Other general symptoms and signs: Secondary | ICD-10-CM | POA: Diagnosis not present

## 2019-06-30 DIAGNOSIS — M722 Plantar fascial fibromatosis: Secondary | ICD-10-CM

## 2019-06-30 DIAGNOSIS — Z20822 Contact with and (suspected) exposure to covid-19: Secondary | ICD-10-CM

## 2019-06-30 MED ORDER — TERBINAFINE HCL 250 MG PO TABS: 250.0000 mg | ORAL_TABLET | Freq: Every day | ORAL | 0 refills | Status: DC

## 2019-07-02 LAB — NOVEL CORONAVIRUS, NAA: SARS-CoV-2, NAA: NOT DETECTED

## 2019-07-03 NOTE — Progress Notes (Signed)
   Subjective: 33 y.o. female presenting today with a chief complaint of possible nail fungus to the bilateral great toes that has been present for the past several years. She reports associated thickening and discoloration with intermittent tenderness. She denies any modifying factors. She has not had any treatment for the symptoms. Patient is here for further evaluation and treatment.   Past Medical History:  Diagnosis Date  . Anemia   . Chronic lower back pain   . GERD (gastroesophageal reflux disease)     Objective: Physical Exam General: The patient is alert and oriented x3 in no acute distress.  Dermatology: Hyperkeratotic, discolored, thickened, onychodystrophy noted to the bilateral great toenails. Skin is warm, dry and supple bilateral lower extremities. Negative for open lesions or macerations.  Vascular: Palpable pedal pulses bilaterally. No edema or erythema noted. Capillary refill within normal limits.  Neurological: Epicritic and protective threshold grossly intact bilaterally.   Musculoskeletal Exam: Range of motion within normal limits to all pedal and ankle joints bilateral. Muscle strength 5/5 in all groups bilateral.   Assessment: #1 Onychomycosis bilateral great toenails #2 Hyperkeratotic nails bilateral great toes  Plan of Care:  #1 Patient was evaluated. #2 Mechanical debridement of nails bilateral great toenails performed using a nail nipper. Filed with dremel without incident.  #3 Prescription for Lamisil 250 mg #90 provided to patient.  #4 Return to clinic as needed.    Edrick Kins, DPM [facility]  Dr. Edrick Kins, [address]                                        [place], [place] [address]                Office [phone]  Fax [phone]

## 2019-08-08 ENCOUNTER — Other Ambulatory Visit: Payer: Self-pay

## 2019-08-08 DIAGNOSIS — Z20828 Contact with and (suspected) exposure to other viral communicable diseases: Secondary | ICD-10-CM | POA: Diagnosis not present

## 2019-08-08 DIAGNOSIS — Z20822 Contact with and (suspected) exposure to covid-19: Secondary | ICD-10-CM

## 2019-08-10 LAB — NOVEL CORONAVIRUS, NAA: SARS-CoV-2, NAA: NOT DETECTED

## 2019-10-31 ENCOUNTER — Ambulatory Visit: Payer: BC Managed Care – PPO | Admitting: Podiatry

## 2019-11-03 ENCOUNTER — Ambulatory Visit: Payer: BC Managed Care – PPO | Admitting: Podiatry

## 2019-11-16 ENCOUNTER — Ambulatory Visit: Payer: BC Managed Care – PPO | Attending: Internal Medicine

## 2019-11-16 DIAGNOSIS — Z20822 Contact with and (suspected) exposure to covid-19: Secondary | ICD-10-CM | POA: Diagnosis not present

## 2019-11-17 ENCOUNTER — Ambulatory Visit
Admission: EM | Admit: 2019-11-17 | Discharge: 2019-11-17 | Disposition: A | Discharge status: Home / Self Care | Payer: BC Managed Care – PPO | Attending: Emergency Medicine | Admitting: Emergency Medicine

## 2019-11-17 ENCOUNTER — Telehealth: Payer: Self-pay | Admitting: *Deleted

## 2019-11-17 ENCOUNTER — Encounter: Payer: Self-pay | Admitting: Emergency Medicine

## 2019-11-17 ENCOUNTER — Other Ambulatory Visit: Payer: Self-pay

## 2019-11-17 DIAGNOSIS — R110 Nausea: Secondary | ICD-10-CM

## 2019-11-17 DIAGNOSIS — R05 Cough: Secondary | ICD-10-CM

## 2019-11-17 DIAGNOSIS — R519 Headache, unspecified: Secondary | ICD-10-CM | POA: Diagnosis not present

## 2019-11-17 DIAGNOSIS — R5383 Other fatigue: Secondary | ICD-10-CM

## 2019-11-17 DIAGNOSIS — J029 Acute pharyngitis, unspecified: Secondary | ICD-10-CM

## 2019-11-17 DIAGNOSIS — B349 Viral infection, unspecified: Secondary | ICD-10-CM | POA: Insufficient documentation

## 2019-11-17 LAB — POCT RAPID STREP A (OFFICE): Rapid Strep A Screen: NEGATIVE

## 2019-11-17 LAB — NOVEL CORONAVIRUS, NAA: SARS-CoV-2, NAA: NOT DETECTED

## 2019-11-17 MED ORDER — BENZONATATE 100 MG PO CAPS: 100.0000 mg | ORAL_CAPSULE | Freq: Three times a day (TID) | ORAL | 0 refills | Status: DC | PRN

## 2019-11-17 MED ORDER — ONDANSETRON HCL 4 MG PO TABS: 4.0000 mg | ORAL_TABLET | Freq: Four times a day (QID) | ORAL | 0 refills | Status: DC

## 2019-11-17 NOTE — ED Triage Notes (Signed)
Patient in office today c/o headache,cough,sore throat on 11/14/19.Patient denies having fever.  Had covid send out test done yesterday. Awaiting result  OTC:[place]

## 2019-11-17 NOTE — Discharge Instructions (Signed)
Your rapid strep test is negative.  A throat culture is pending; we will call you if it is positive requiring treatment.    You should self quarantine until your COVID test result is back.    Go to the emergency department if you have a high fever not relieved by Tylenol, shortness of breath, severe vomiting or diarrhea, or other concerning symptoms.    Take the ondansetron as needed for nausea or vomiting.  Take the [facility] as needed for your cough.    Rest and stay hydrated.    Follow-up with your primary care provider if your symptoms are not improving.

## 2019-11-17 NOTE — Telephone Encounter (Signed)
Patient called for results ,still pending . 

## 2019-11-17 NOTE — ED Provider Notes (Signed)
Denise Houston    CSN: [id number] Arrival date & time: 11/17/19  1555      History   Chief Complaint Chief Complaint  Patient presents with  . Sore Throat  . Cough  . Nausea  . Headache    HPI Denise Houston is a 34 y.o. female.   Patient presents with sore throat, nonproductive cough, headache, fatigue x4 days.  She also reports intermittent nausea.  She had a PCR COVID test performed yesterday; results pending.  She denies fever, chills, rash, difficulty swallowing, shortness of breath, vomiting, diarrhea, or other symptoms.  Treatment attempted at home with ibuprofen.    The history is provided by the patient.    Past Medical History:  Diagnosis Date  . Anemia   . Chronic lower back pain   . GERD (gastroesophageal reflux disease)     Patient Active Problem List   Diagnosis Date Noted  . Encounter for maternal care for low transverse scar from previous cesarean delivery 10/13/2017  . Anemia complicating pregnancy 84/16/6063  . Chronic low back pain without sciatica 04/06/2017  . Supervision of high risk pregnancy, antepartum 03/19/2017  . History of cesarean delivery 03/19/2017    Past Surgical History:  Procedure Laterality Date  . CESAREAN SECTION  2011  . CESAREAN SECTION  2015  . CESAREAN SECTION  2018  . CESAREAN SECTION WITH BILATERAL TUBAL LIGATION Bilateral 10/13/2017   Procedure: CESAREAN SECTION WITH BILATERAL TUBAL LIGATION;  Surgeon: Malachy Mood, MD;  Location: [facility] ORS;  Service: Obstetrics;  Laterality: Bilateral;  . DILATION AND CURETTAGE OF UTERUS  07/2010  . INNER EAR SURGERY Left 2003  . TONSILLECTOMY      OB History    Gravida  5   Para  3   Term  3   Preterm      AB  2   Living  3     SAB  2   TAB      Ectopic      Multiple  0   Live Births  3            Home Medications    Prior to Admission medications   Medication Sig Start Date End Date Taking? Authorizing Provider  benzonatate (TESSALON) 100  MG capsule Take 1 capsule (100 mg total) by mouth 3 (three) times daily as needed for cough. 11/17/19   Sharion Balloon, NP  ondansetron (ZOFRAN) 4 MG tablet Take 1 tablet (4 mg total) by mouth every 6 (six) hours. 11/17/19   Sharion Balloon, NP  terbinafine (LAMISIL) 250 MG tablet Take 1 tablet (250 mg total) by mouth daily. 06/30/19   Edrick Kins, DPM    Family History Family History  Problem Relation Age of Onset  . Diabetes Mother        Type 2  . Diabetes Father        Type 2  . Diabetes Maternal Grandmother        Type 2    Social History Social History   Tobacco Use  . Smoking status: Former Smoker    Quit date: 09/12/2017    Years since quitting: 2.1  . Smokeless tobacco: Never Used  Substance Use Topics  . Alcohol use: No  . Drug use: No     Allergies   Patient has no known allergies.   Review of Systems Review of Systems  Constitutional: Positive for fatigue. Negative for chills and fever.  HENT: Positive for sore  throat. Negative for ear pain.   Eyes: Negative for pain and visual disturbance.  Respiratory: Positive for cough. Negative for shortness of breath.   Cardiovascular: Negative for chest pain and palpitations.  Gastrointestinal: Positive for nausea. Negative for abdominal pain, diarrhea and vomiting.  Genitourinary: Negative for dysuria and hematuria.  Musculoskeletal: Negative for arthralgias and back pain.  Skin: Negative for color change and rash.  Neurological: Positive for headaches. Negative for dizziness, seizures, syncope, weakness and numbness.  All other systems reviewed and are negative.    Physical Exam Triage Vital Signs ED Triage Vitals  Enc Vitals Group     BP      Pulse      Resp      Temp      Temp src      SpO2      Weight      Height      Head Circumference      Peak Flow      Pain Score      Pain Loc      Pain Edu?      Excl. in GC?    No data found.  Updated Vital Signs BP 121/83 (BP Location: Left Arm)    Pulse 91   Temp 98.6 F (37 C) (Oral)   Resp 18   Ht 5\' 7"  (1.702 m)   Wt 175 lb (79.4 kg)   LMP 11/12/2019   SpO2 98%   BMI 27.41 kg/m   Visual Acuity Right Eye Distance:   Left Eye Distance:   Bilateral Distance:    Right Eye Near:   Left Eye Near:    Bilateral Near:     Physical Exam Vitals and nursing note reviewed.  Constitutional:      General: She is not in acute distress.    Appearance: She is well-developed. She is not ill-appearing.  HENT:     Head: Normocephalic and atraumatic.     Right Ear: Tympanic membrane normal.     Left Ear: Tympanic membrane normal.     Nose: Nose normal.     Mouth/Throat:     Mouth: Mucous membranes are moist.     Pharynx: Posterior oropharyngeal erythema present. No oropharyngeal exudate.  Eyes:     Conjunctiva/sclera: Conjunctivae normal.  Cardiovascular:     Rate and Rhythm: Normal rate and regular rhythm.     Heart sounds: No murmur.  Pulmonary:     Effort: Pulmonary effort is normal. No respiratory distress.     Breath sounds: Normal breath sounds. No wheezing or rhonchi.  Abdominal:     General: Bowel sounds are normal.     Palpations: Abdomen is soft.     Tenderness: There is no abdominal tenderness. There is no guarding or rebound.  Musculoskeletal:     Cervical back: Neck supple.  Skin:    General: Skin is warm and dry.     Findings: No rash.  Neurological:     General: No focal deficit present.     Mental Status: She is alert and oriented to person, place, and time.  Psychiatric:        Mood and Affect: Mood normal.        Behavior: Behavior normal.      UC Treatments / Results  Labs (all labs ordered are listed, but only abnormal results are displayed) Labs Reviewed  CULTURE, GROUP A STREP [facility])  POCT RAPID STREP A (OFFICE)    EKG   Radiology No  results found.  Procedures Procedures (including critical care time)  Medications Ordered in UC Medications - No data to display  Initial  Impression / Assessment and Plan / UC Course  I have reviewed the triage vital signs and the nursing notes.  Pertinent labs & imaging results that were available during my care of the patient were reviewed by me and considered in my medical decision making (see chart for details).    Viral illness.  Rapid strep negative; throat culture pending.  Instructed patient to self quarantine until her Covid test result is back.  Treating nausea with Zofran and cough with Tessalon Perles.  Instructed patient to go to the emergency department if she has high fever not relieved by Tylenol, shortness of breath, severe vomiting or diarrhea, or other concerning symptoms.  Instructed her to follow-up with her PCP if her symptoms are not improving.  Patient agrees to plan of care.     Final Clinical Impressions(s) / UC Diagnoses   Final diagnoses:  Viral illness     Discharge Instructions     Your rapid strep test is negative.  A throat culture is pending; we will call you if it is positive requiring treatment.    You should self quarantine until your COVID test result is back.    Go to the emergency department if you have a high fever not relieved by Tylenol, shortness of breath, severe vomiting or diarrhea, or other concerning symptoms.    Take the ondansetron as needed for nausea or vomiting.  Take the [facility] as needed for your cough.    Rest and stay hydrated.    Follow-up with your primary care provider if your symptoms are not improving.        ED Prescriptions    Medication Sig Dispense Auth. Provider   benzonatate (TESSALON) 100 MG capsule Take 1 capsule (100 mg total) by mouth 3 (three) times daily as needed for cough. 21 capsule Mickie Bail, NP   ondansetron (ZOFRAN) 4 MG tablet Take 1 tablet (4 mg total) by mouth every 6 (six) hours. 12 tablet Mickie Bail, NP     PDMP not reviewed this encounter.   Mickie Bail, NP 11/17/19 [phone]

## 2019-11-21 ENCOUNTER — Other Ambulatory Visit: Payer: Self-pay

## 2019-11-21 ENCOUNTER — Ambulatory Visit: Payer: BC Managed Care – PPO | Admitting: Podiatry

## 2019-11-21 DIAGNOSIS — B351 Tinea unguium: Secondary | ICD-10-CM

## 2019-11-21 MED ORDER — TERBINAFINE HCL 250 MG PO TABS: 250.0000 mg | ORAL_TABLET | Freq: Every day | ORAL | 0 refills | Status: DC

## 2019-11-22 LAB — CULTURE, GROUP A STREP (THRC)

## 2019-11-23 NOTE — Progress Notes (Signed)
   Subjective: 34 y.o. female presenting today for follow up evaluation of fungus noted to the bilateral great toenails. She states she took the prescribed Lamisil as directed and has noticed improvement in the nails but reports some continued thickening and discoloration. There are no worsening factors noted. Patient is here for further evaluation and treatment.   Past Medical History:  Diagnosis Date  . Anemia   . Chronic lower back pain   . GERD (gastroesophageal reflux disease)     Objective: Physical Exam General: The patient is alert and oriented x3 in no acute distress.  Dermatology: Hyperkeratotic, discolored, thickened, onychodystrophy noted to the bilateral great toenails. Skin is warm, dry and supple bilateral lower extremities. Negative for open lesions or macerations.  Vascular: Palpable pedal pulses bilaterally. No edema or erythema noted. Capillary refill within normal limits.  Neurological: Epicritic and protective threshold grossly intact bilaterally.   Musculoskeletal Exam: Range of motion within normal limits to all pedal and ankle joints bilateral. Muscle strength 5/5 in all groups bilateral.   Assessment: #1 Onychomycosis bilateral great toenails  #2 Hyperkeratotic nails bilateral great toenails   Plan of Care:  #1 Patient was evaluated. #2 Refill prescription for Lamisil 250 mg #90 provided to patient.  #3 Liver function test ordered. The office will contact patient if labs are abnormal.  #4 Return to clinic as needed.    Felecia Shelling, DPM [facility]  Dr. Felecia Shelling, DPM    [address]                                        [place], [place] [address]                Office [phone]  Fax [phone]

## 2019-12-01 ENCOUNTER — Ambulatory Visit: Payer: BC Managed Care – PPO | Admitting: Obstetrics and Gynecology

## 2019-12-20 ENCOUNTER — Other Ambulatory Visit: Payer: Self-pay

## 2019-12-20 ENCOUNTER — Encounter: Payer: Self-pay | Admitting: Obstetrics and Gynecology

## 2019-12-20 ENCOUNTER — Ambulatory Visit (INDEPENDENT_AMBULATORY_CARE_PROVIDER_SITE_OTHER): Payer: BC Managed Care – PPO | Admitting: Obstetrics and Gynecology

## 2019-12-20 VITALS — BP 120/80 | Ht 67.0 in | Wt 186.0 lb

## 2019-12-20 DIAGNOSIS — Z1239 Encounter for other screening for malignant neoplasm of breast: Secondary | ICD-10-CM

## 2019-12-20 DIAGNOSIS — Z3009 Encounter for other general counseling and advice on contraception: Secondary | ICD-10-CM

## 2019-12-20 DIAGNOSIS — N938 Other specified abnormal uterine and vaginal bleeding: Secondary | ICD-10-CM

## 2019-12-20 DIAGNOSIS — Z01411 Encounter for gynecological examination (general) (routine) with abnormal findings: Secondary | ICD-10-CM | POA: Diagnosis not present

## 2019-12-20 DIAGNOSIS — Z01419 Encounter for gynecological examination (general) (routine) without abnormal findings: Secondary | ICD-10-CM

## 2019-12-20 DIAGNOSIS — N939 Abnormal uterine and vaginal bleeding, unspecified: Secondary | ICD-10-CM

## 2019-12-20 MED ORDER — LO LOESTRIN FE 1 MG-10 MCG / 10 MCG PO TABS: 1.0000 | ORAL_TABLET | Freq: Every day | ORAL | 3 refills | Status: DC

## 2019-12-20 NOTE — Progress Notes (Signed)
Gynecology Annual Exam   PCP: Patient, No Pcp Per  Chief Complaint:  Chief Complaint  Patient presents with  . Gynecologic Exam    History of Present Illness: Patient is a 34 y.o. [id number] presents for annual exam. The patient has no complaints today.   LMP: Patient's last menstrual period was 12/11/2019. Cycles and length of flow have been irregular since tubal ligation.  The patient is sexually active. She currently uses tubal ligation for contraception. She denies dyspareunia.  The patient does not perform self breast exams.  There is no notable family history of breast or ovarian cancer in her family.  The patient wears seatbelts: yes.   The patient has regular exercise: not asked.    The patient denies current symptoms of depression.    Review of Systems: Review of Systems  Constitutional: Positive for malaise/fatigue. Negative for chills and fever.  HENT: Negative for congestion.   Respiratory: Negative for cough and shortness of breath.   Cardiovascular: Negative for chest pain and palpitations.  Gastrointestinal: Negative for abdominal pain, constipation, diarrhea, heartburn, nausea and vomiting.  Genitourinary: Negative for dysuria, frequency and urgency.  Skin: Negative for itching and rash.  Neurological: Negative for dizziness and headaches.  Endo/Heme/Allergies: Negative for polydipsia.  Psychiatric/Behavioral: Negative for depression.    Past Medical History:  Past Medical History:  Diagnosis Date  . Anemia   . Chronic lower back pain   . GERD (gastroesophageal reflux disease)     Past Surgical History:  Past Surgical History:  Procedure Laterality Date  . CESAREAN SECTION  2011  . CESAREAN SECTION  2015  . CESAREAN SECTION  2018  . CESAREAN SECTION WITH BILATERAL TUBAL LIGATION Bilateral 10/13/2017   Procedure: CESAREAN SECTION WITH BILATERAL TUBAL LIGATION;  Surgeon: Vena Austria, MD;  Location: [facility] ORS;  Service: Obstetrics;  Laterality:  Bilateral;  . DILATION AND CURETTAGE OF UTERUS  07/2010  . INNER EAR SURGERY Left 2003  . TONSILLECTOMY      Gynecologic History:  Patient's last menstrual period was 12/11/2019. Contraception: tubal ligation Last Pap: Results were:03/19/2017 NIL and HR HPV negative   Obstetric History: [id number]  Family History:  Family History  Problem Relation Age of Onset  . Diabetes Mother        Type 2  . Diabetes Father        Type 2  . Diabetes Maternal Grandmother        Type 2    Social History:  Social History   Socioeconomic History  . Marital status: Single    Spouse name: Not on file  . Number of children: Not on file  . Years of education: Not on file  . Highest education level: Not on file  Occupational History  . Not on file  Tobacco Use  . Smoking status: Former Smoker    Quit date: 09/12/2017    Years since quitting: 2.2  . Smokeless tobacco: Never Used  Substance and Sexual Activity  . Alcohol use: No  . Drug use: No  . Sexual activity: Never  Other Topics Concern  . Not on file  Social History Narrative  . Not on file   Social Determinants of Health   Financial Resource Strain:   . Difficulty of Paying Living Expenses: Not on file  Food Insecurity:   . Worried About Programme researcher, broadcasting/film/video in the Last Year: Not on file  . Ran Out of Food in the Last Year: Not on file  Transportation Needs:   . Film/video editor (Medical): Not on file  . Lack of Transportation (Non-Medical): Not on file  Physical Activity:   . Days of Exercise per Week: Not on file  . Minutes of Exercise per Session: Not on file  Stress:   . Feeling of Stress : Not on file  Social Connections:   . Frequency of Communication with Friends and Family: Not on file  . Frequency of Social Gatherings with Friends and Family: Not on file  . Attends Religious Services: Not on file  . Active Member of Clubs or Organizations: Not on file  . Attends Archivist Meetings: Not on file    . Marital Status: Not on file  Intimate Partner Violence:   . Fear of Current or Ex-Partner: Not on file  . Emotionally Abused: Not on file  . Physically Abused: Not on file  . Sexually Abused: Not on file    Allergies:  No Known Allergies  Medications: Prior to Admission medications   Medication Sig Start Date End Date Taking? Authorizing Provider  benzonatate (TESSALON) 100 MG capsule Take 1 capsule (100 mg total) by mouth 3 (three) times daily as needed for cough. Patient not taking: Reported on 12/20/2019 11/17/19   Sharion Balloon, NP  ondansetron (ZOFRAN) 4 MG tablet Take 1 tablet (4 mg total) by mouth every 6 (six) hours. Patient not taking: Reported on 12/20/2019 11/17/19   Sharion Balloon, NP  terbinafine (LAMISIL) 250 MG tablet Take 1 tablet (250 mg total) by mouth daily. Patient not taking: Reported on 12/20/2019 11/21/19   Edrick Kins, DPM    Physical Exam Vitals: Blood pressure 120/80, height 5\' 7"  (1.702 m), weight 186 lb (84.4 kg), last menstrual period 12/11/2019.  General: NAD HEENT: normocephalic, anicteric Thyroid: no enlargement, no palpable nodules Pulmonary: No increased work of breathing, CTAB Cardiovascular: RRR, distal pulses 2+ Breast: Breast symmetrical, no tenderness, no palpable nodules or masses, no skin or nipple retraction present, no nipple discharge.  No axillary or supraclavicular lymphadenopathy. Abdomen: NABS, soft, non-tender, non-distended.  Umbilicus without lesions.  No hepatomegaly, splenomegaly or masses palpable. No evidence of hernia  Genitourinary:  External: Normal external female genitalia.  Normal urethral meatus, normal Bartholin's and Skene's glands.    Vagina: Normal vaginal mucosa, no evidence of prolapse.    Cervix: Grossly normal in appearance, no bleeding  Uterus: Non-enlarged, mobile, normal contour.  No CMT  Adnexa: ovaries non-enlarged, no adnexal masses  Rectal: deferred  Lymphatic: no evidence of inguinal  lymphadenopathy Extremities: no edema, erythema, or tenderness Neurologic: Grossly intact Psychiatric: mood appropriate, affect full  Female chaperone present for pelvic and breast  portions of the physical exam    Assessment: 34 y.o. [id number] routine annual exam  Plan: Problem List Items Addressed This Visit    None    Visit Diagnoses    General counseling and advice on female contraception    -  Primary   Abnormal uterine bleeding       Encounter for gynecological examination without abnormal finding       Breast screening          2) STI screening  was notoffered and therefore not obtained  2)  ASCCP guidelines and rational discussed.  Patient opts for every 3 years screening interval  3) Contraception - the patient is currently using  tubal ligation.  She is not currently in need of contraception secondary to being sterile - given AUB will trial on  lo loestrin FE  4) Routine healthcare maintenance including cholesterol, diabetes screening discussed managed by PCP  5) Return in about 1 year (around 12/19/2020) for annual.   Vena Austria, MD, [facility], [facility] 12/20/2019, 9:09 AM

## 2020-01-22 ENCOUNTER — Encounter: Payer: Self-pay | Admitting: Emergency Medicine

## 2020-01-22 ENCOUNTER — Other Ambulatory Visit: Payer: Self-pay

## 2020-01-22 ENCOUNTER — Ambulatory Visit
Admission: EM | Admit: 2020-01-22 | Discharge: 2020-01-22 | Disposition: A | Discharge status: Home / Self Care | Payer: BC Managed Care – PPO | Attending: Urgent Care | Admitting: Urgent Care

## 2020-01-22 DIAGNOSIS — R509 Fever, unspecified: Secondary | ICD-10-CM | POA: Insufficient documentation

## 2020-01-22 DIAGNOSIS — J029 Acute pharyngitis, unspecified: Secondary | ICD-10-CM

## 2020-01-22 DIAGNOSIS — B349 Viral infection, unspecified: Secondary | ICD-10-CM | POA: Diagnosis not present

## 2020-01-22 DIAGNOSIS — R05 Cough: Secondary | ICD-10-CM | POA: Diagnosis not present

## 2020-01-22 DIAGNOSIS — R070 Pain in throat: Secondary | ICD-10-CM

## 2020-01-22 DIAGNOSIS — Z1152 Encounter for screening for COVID-19: Secondary | ICD-10-CM | POA: Diagnosis not present

## 2020-01-22 DIAGNOSIS — R059 Cough, unspecified: Secondary | ICD-10-CM

## 2020-01-22 LAB — POCT RAPID STREP A (OFFICE): Rapid Strep A Screen: NEGATIVE

## 2020-01-22 MED ORDER — PROMETHAZINE-DM 6.25-15 MG/5ML PO SYRP: 5.0000 mL | ORAL_SOLUTION | Freq: Two times a day (BID) | ORAL | 0 refills | Status: DC | PRN

## 2020-01-22 NOTE — ED Triage Notes (Signed)
Patient in today c/o sore throat since yesterday. Patient denies fever. Patient states her daughter was diagnosed with strep throat this morning. Patient's son was diagnosed with Norovirus last week.

## 2020-01-22 NOTE — ED Provider Notes (Signed)
Renae Gloss   MRN: [id number] DOB: Feb 03, 1986  Subjective:   KYLIE SIMMONDS is a 34 y.o. female presenting for 1 day history of throat pain.  She has also been coughing and having intermittent subjective fever, general malaise since Friday.  One of her children has strep throat and another has no virus.  No current facility-administered medications for this encounter.  Current Outpatient Medications:  .  Norethindrone-Ethinyl Estradiol-Fe Biphas (LO LOESTRIN FE) 1 MG-10 MCG / 10 MCG tablet, Take 1 tablet by mouth daily., Disp: 84 tablet, Rfl: 3 .  terbinafine (LAMISIL) 250 MG tablet, Take 1 tablet (250 mg total) by mouth daily., Disp: 90 tablet, Rfl: 0 .  benzonatate (TESSALON) 100 MG capsule, Take 1 capsule (100 mg total) by mouth 3 (three) times daily as needed for cough. (Patient not taking: Reported on 12/20/2019), Disp: 21 capsule, Rfl: 0 .  ondansetron (ZOFRAN) 4 MG tablet, Take 1 tablet (4 mg total) by mouth every 6 (six) hours. (Patient not taking: Reported on 12/20/2019), Disp: 12 tablet, Rfl: 0   No Known Allergies  Past Medical History:  Diagnosis Date  . Anemia   . Chronic lower back pain   . GERD (gastroesophageal reflux disease)      Past Surgical History:  Procedure Laterality Date  . CESAREAN SECTION  2011  . CESAREAN SECTION  2015  . CESAREAN SECTION  2018  . CESAREAN SECTION WITH BILATERAL TUBAL LIGATION Bilateral 10/13/2017   Procedure: CESAREAN SECTION WITH BILATERAL TUBAL LIGATION;  Surgeon: Malachy Mood, MD;  Location: [facility] ORS;  Service: Obstetrics;  Laterality: Bilateral;  . DILATION AND CURETTAGE OF UTERUS  07/2010  . INNER EAR SURGERY Left 2003  . TONSILLECTOMY      Family History  Problem Relation Age of Onset  . Thyroid disease Mother   . Other Father        unknown medical history    Social History   Tobacco Use  . Smoking status: Former Smoker    Quit date: 09/12/2017    Years since quitting: 2.3  . Smokeless tobacco:  Never Used  Substance Use Topics  . Alcohol use: Yes    Comment: rarely  . Drug use: No    Review of Systems  Constitutional: Positive for fever (subjective) and malaise/fatigue.  HENT: Positive for congestion and sore throat. Negative for ear pain and sinus pain.   Eyes: Negative for discharge and redness.  Respiratory: Positive for cough. Negative for hemoptysis, shortness of breath and wheezing.   Cardiovascular: Negative for chest pain.  Gastrointestinal: Negative for abdominal pain, diarrhea, nausea and vomiting.  Genitourinary: Negative for dysuria, flank pain and hematuria.  Musculoskeletal: Negative for myalgias.  Skin: Negative for rash.  Neurological: Negative for dizziness, weakness and headaches.  Psychiatric/Behavioral: Negative for depression and substance abuse.     Objective:   Vitals: BP 114/76 (BP Location: Left Arm)   Pulse 87   Temp 98.7 F (37.1 C) (Oral)   Resp 18   Ht 5\' 7"  (1.702 m)   Wt 175 lb (79.4 kg)   LMP 01/08/2020 (Approximate) Comment: tubal ligation  SpO2 97%   BMI 27.41 kg/m   Physical Exam Constitutional:      General: She is not in acute distress.    Appearance: Normal appearance. She is well-developed. She is not ill-appearing, toxic-appearing or diaphoretic.  HENT:     Head: Normocephalic and atraumatic.     Right Ear: Tympanic membrane and ear canal normal. No drainage or  tenderness. No middle ear effusion. Tympanic membrane is not erythematous.     Left Ear: Tympanic membrane and ear canal normal. No drainage or tenderness.  No middle ear effusion. Tympanic membrane is not erythematous.     Nose: Congestion and rhinorrhea present.     Mouth/Throat:     Mouth: Mucous membranes are moist. No oral lesions.     Pharynx: No pharyngeal swelling, oropharyngeal exudate, posterior oropharyngeal erythema or uvula swelling.     Tonsils: No tonsillar exudate or tonsillar abscesses.     Comments: Post-nasal drainage overlying  pharynx. Eyes:     Extraocular Movements: Extraocular movements intact.     Right eye: Normal extraocular motion.     Left eye: Normal extraocular motion.     Conjunctiva/sclera: Conjunctivae normal.     Pupils: Pupils are equal, round, and reactive to light.  Cardiovascular:     Rate and Rhythm: Normal rate and regular rhythm.     Pulses: Normal pulses.     Heart sounds: Normal heart sounds. No murmur. No friction rub. No gallop.   Pulmonary:     Effort: Pulmonary effort is normal. No respiratory distress.     Breath sounds: Normal breath sounds. No stridor. No wheezing, rhonchi or rales.  Musculoskeletal:     Cervical back: Normal range of motion and neck supple.  Lymphadenopathy:     Cervical: No cervical adenopathy.  Skin:    General: Skin is warm and dry.     Findings: No rash.  Neurological:     General: No focal deficit present.     Mental Status: She is alert and oriented to person, place, and time.  Psychiatric:        Mood and Affect: Mood normal.        Behavior: Behavior normal.        Thought Content: Thought content normal.     Results for orders placed or performed during the hospital encounter of 01/22/20 (from the past 24 hour(s))  POCT rapid strep A     Status: None   Collection Time: 01/22/20  4:36 PM  Result Value Ref Range   Rapid Strep A Screen Negative Negative    Assessment and Plan :   1. Viral syndrome   2. Throat pain   3. Cough   4. Fever, unspecified     Will manage for viral illness such as viral URI, viral syndrome, viral rhinitis, COVID-19. Counseled patient on nature of COVID-19 including modes of transmission, diagnostic testing, management and supportive care.  Offered symptomatic relief. COVID 19 testing is pending. Counseled patient on potential for adverse effects with medications prescribed/recommended today, ER and return-to-clinic precautions discussed, patient verbalized understanding.     Wallis Bamberg, [place] 01/22/20 1703

## 2020-01-22 NOTE — Discharge Instructions (Signed)
We will notify you of your COVID-19 test results as they arrive and may take between 24 to 48 hours.  I encourage you to sign up for MyChart if you have not already done so as this can be the easiest way for [place] to communicate results to you online or through a phone app.  In the meantime, if you develop worsening symptoms including fever, chest pain, shortness of breath despite our current treatment plan then please report to the emergency room as this may be a sign of worsening status from possible COVID-19 infection.  Otherwise, we will manage this as a viral syndrome. For sore throat or cough try using a honey-based tea. Use 3 teaspoons of honey with juice squeezed from half lemon. Place shaved pieces of ginger into 1/2-1 cup of water and warm over stove top. Then mix the ingredients and repeat every 4 hours as needed. Please take Tylenol 500mg -650mg  every 6 hours. Hydrate very well with at least 2 liters of water. Eat light meals such as soups to replenish electrolytes and soft fruits, veggies. Start an antihistamine like Zyrtec, Allegra or Claritin for postnasal drainage, sinus congestion.  You can take this together with pseudoephedrine (Sudafed) at a dose of 60 mg 2-3 times a day as needed for the same kind of congestion.

## 2020-01-24 LAB — SARS-COV-2, NAA 2 DAY TAT

## 2020-01-24 LAB — NOVEL CORONAVIRUS, NAA: SARS-CoV-2, NAA: NOT DETECTED

## 2020-01-25 LAB — CULTURE, GROUP A STREP (THRC)

## 2020-02-16 ENCOUNTER — Other Ambulatory Visit: Payer: Self-pay | Admitting: Obstetrics and Gynecology

## 2020-02-16 ENCOUNTER — Telehealth: Payer: Self-pay

## 2020-02-16 MED ORDER — LO LOESTRIN FE 1 MG-10 MCG / 10 MCG PO TABS: 1.0000 | ORAL_TABLET | Freq: Every day | ORAL | 3 refills | Status: DC

## 2020-02-16 NOTE — Telephone Encounter (Signed)
Spoke w/pt. Advised AMS send full year rx on 12/20/19. Pt to contact pharmacy to have rx filled.

## 2020-02-16 NOTE — Telephone Encounter (Signed)
Patient reports she has used all the samples of Lo Loestrin AMS gave her. They are helping with her period flow. Requesting rx. AY#[phone]

## 2020-02-16 NOTE — Telephone Encounter (Signed)
I;ve also refilled just in case

## 2020-05-07 ENCOUNTER — Telehealth: Payer: Self-pay

## 2020-05-07 NOTE — Telephone Encounter (Signed)
Patient has finished her 3 month rx of Lo lo. States AMS said she may need something stronger or change completely. XM#[phone]

## 2020-05-07 NOTE — Telephone Encounter (Signed)
First month seemed like it was helping. This last month it's like it was before. He had mentioned a higher dosing. She wasn't sure what she should try next.

## 2020-05-08 ENCOUNTER — Other Ambulatory Visit: Payer: Self-pay | Admitting: Obstetrics and Gynecology

## 2020-05-08 MED ORDER — DROSPIRENONE-ETHINYL ESTRADIOL 3-0.02 MG PO TABS: 1.0000 | ORAL_TABLET | Freq: Every day | ORAL | 11 refills | Status: DC

## 2020-05-08 NOTE — Telephone Encounter (Signed)
Pt aware.

## 2020-05-08 NOTE — Telephone Encounter (Signed)
Switched to [employer] Rx has been called in

## 2020-10-01 ENCOUNTER — Other Ambulatory Visit: Payer: Self-pay

## 2020-10-01 ENCOUNTER — Telehealth: Payer: BC Managed Care – PPO | Admitting: Nurse Practitioner

## 2020-10-01 NOTE — Progress Notes (Signed)
   Subjective:    Patient ID: Denise Houston, female    DOB: Nov 07, 1985, 34 y.o.   MRN: [id number]  HPI  34 year old female complaints a cough and a headache, denies fever. No history of covid or vaccination.  Started to have symptoms 09/26/20. Went to [facility] testing center today and tested positive for COVID-19 on rapid testing.   Takes care of three small children, has help from her mother.   Works in Fish farm manager at [employer]  Review of Systems  Constitutional: Negative.   Respiratory: Positive for cough.   Neurological: Positive for headaches.       Objective:   Physical Exam This was a telehealth appointment with patient she was tested off site.       Assessment & Plan:  Discussed with patient treatment of COVID with OTC medications including dayquil/nyquil, advil.   Encouraged monoclonal antibody infusion, gave number of patient self referral line at [facility], also discussed with patient that we can refer her to [facility] for infusion if she would like to go forward with it. Discussed that this is a treatment that is typically only indicated for the first week of symptoms and encouraged calling today. She will look into it and call the clinic back if she would like a referral to [facility] clinic.   Encouraged rest, assuring hydration and adequate diet- to call clinic if symptoms are worsening or with any new concerns. Seek immediate medical attention for any acute new or worsening symptoms  Will check in with patient later this week and readdress return to work date at that time.

## 2020-12-26 DIAGNOSIS — R5383 Other fatigue: Secondary | ICD-10-CM | POA: Diagnosis not present

## 2020-12-26 DIAGNOSIS — F39 Unspecified mood [affective] disorder: Secondary | ICD-10-CM | POA: Diagnosis not present

## 2020-12-26 DIAGNOSIS — F339 Major depressive disorder, recurrent, unspecified: Secondary | ICD-10-CM | POA: Diagnosis not present

## 2020-12-26 DIAGNOSIS — Z6827 Body mass index (BMI) 27.0-27.9, adult: Secondary | ICD-10-CM | POA: Diagnosis not present

## 2021-01-12 ENCOUNTER — Emergency Department: Payer: BC Managed Care – PPO

## 2021-01-12 ENCOUNTER — Emergency Department
Admission: EM | Admit: 2021-01-12 | Discharge: 2021-01-12 | Disposition: A | Discharge status: Home / Self Care | Payer: BC Managed Care – PPO | Attending: Student in an Organized Health Care Education/Training Program | Admitting: Student in an Organized Health Care Education/Training Program

## 2021-01-12 ENCOUNTER — Other Ambulatory Visit: Payer: Self-pay

## 2021-01-12 DIAGNOSIS — M25511 Pain in right shoulder: Secondary | ICD-10-CM | POA: Insufficient documentation

## 2021-01-12 DIAGNOSIS — M25571 Pain in right ankle and joints of right foot: Secondary | ICD-10-CM

## 2021-01-12 DIAGNOSIS — Z87891 Personal history of nicotine dependence: Secondary | ICD-10-CM | POA: Insufficient documentation

## 2021-01-12 DIAGNOSIS — R079 Chest pain, unspecified: Secondary | ICD-10-CM | POA: Diagnosis not present

## 2021-01-12 DIAGNOSIS — M7989 Other specified soft tissue disorders: Secondary | ICD-10-CM | POA: Diagnosis not present

## 2021-01-12 MED ORDER — ONDANSETRON HCL 4 MG PO TABS: 4.0000 mg | ORAL_TABLET | Freq: Four times a day (QID) | ORAL | 0 refills | Status: DC

## 2021-01-12 MED ORDER — ACETAMINOPHEN 500 MG PO TABS
1000.0000 mg | ORAL_TABLET | Freq: Once | ORAL | Status: AC
  Administered 2021-01-12: 1000 mg via ORAL
  Filled 2021-01-12: qty 2

## 2021-01-12 MED ORDER — OXYCODONE-ACETAMINOPHEN 5-325 MG PO TABS: 1.0000 | ORAL_TABLET | ORAL | 0 refills | Status: DC | PRN
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
## 2021-01-12 NOTE — ED Notes (Signed)
XR at bedside

## 2021-01-13 DIAGNOSIS — S93401A Sprain of unspecified ligament of right ankle, initial encounter: Secondary | ICD-10-CM | POA: Diagnosis not present

## 2021-01-27 DIAGNOSIS — S93401D Sprain of unspecified ligament of right ankle, subsequent encounter: Secondary | ICD-10-CM | POA: Diagnosis not present

## 2021-02-10 DIAGNOSIS — Z6829 Body mass index (BMI) 29.0-29.9, adult: Secondary | ICD-10-CM | POA: Diagnosis not present

## 2021-02-10 DIAGNOSIS — F39 Unspecified mood [affective] disorder: Secondary | ICD-10-CM | POA: Diagnosis not present

## 2021-02-10 DIAGNOSIS — Z124 Encounter for screening for malignant neoplasm of cervix: Secondary | ICD-10-CM | POA: Diagnosis not present

## 2021-02-10 DIAGNOSIS — F339 Major depressive disorder, recurrent, unspecified: Secondary | ICD-10-CM | POA: Diagnosis not present

## 2021-03-06 ENCOUNTER — Other Ambulatory Visit: Payer: Self-pay | Admitting: Obstetrics and Gynecology

## 2021-06-27 ENCOUNTER — Ambulatory Visit: Payer: BC Managed Care – PPO | Admitting: Podiatry

## 2021-06-27 ENCOUNTER — Other Ambulatory Visit: Payer: Self-pay

## 2021-06-27 DIAGNOSIS — L603 Nail dystrophy: Secondary | ICD-10-CM

## 2021-06-27 DIAGNOSIS — B351 Tinea unguium: Secondary | ICD-10-CM

## 2021-07-13 NOTE — Progress Notes (Signed)
   Subjective: 35 y.o. female presenting today for follow up evaluation of fungus noted to the bilateral great toenails. She states she took the prescribed Lamisil as directed but she states that she has not noticed any significant improvement although she was helpful last time.  She continues to have thickening and discoloration to the nails.   Past Medical History:  Diagnosis Date   Anemia    Chronic lower back pain    GERD (gastroesophageal reflux disease)     Objective: Physical Exam General: The patient is alert and oriented x3 in no acute distress.  Dermatology: Hyperkeratotic, discolored, thickened, onychodystrophy noted to the bilateral great toenails. Skin is warm, dry and supple bilateral lower extremities. Negative for open lesions or macerations.  Associated tenderness to palpation  Vascular: Palpable pedal pulses bilaterally. No edema or erythema noted. Capillary refill within normal limits.  Neurological: Epicritic and protective threshold grossly intact bilaterally.   Musculoskeletal Exam: No pedal deformities noted  Assessment: #1 Onychomycosis bilateral great toenails  #2 Hyperkeratotic nails bilateral great toenails   Plan of Care:  #1 Patient was evaluated. #2  Patient states that she completed the oral Lamisil but there has been no improvement.  She is requesting that her toenails be removed today.  We did discuss different treatment options which did include total temporary nail avulsion of the nail plates.  The patient agrees and would like to proceed. #3 total temporary nail avulsions were performed to the bilateral great toes.  Prior to the nail avulsion procedures the toes were prepped in aseptic manner and digital block performed using 3 mL of 2% lidocaine plain.  Light dressing applied and post care instructions provided #4 return to clinic as needed   Felecia Shelling, DPM [facility]  Dr. Felecia Shelling, DPM    [address]                                         [place], [place] [address]                Office [phone]  Fax [phone]

## 2021-07-26 HISTORY — PX: LIPOSUCTION: SHX10

## 2021-08-03 DIAGNOSIS — Z03818 Encounter for observation for suspected exposure to other biological agents ruled out: Secondary | ICD-10-CM | POA: Diagnosis not present

## 2021-08-03 DIAGNOSIS — Z20822 Contact with and (suspected) exposure to covid-19: Secondary | ICD-10-CM | POA: Diagnosis not present

## 2021-08-11 DIAGNOSIS — Z124 Encounter for screening for malignant neoplasm of cervix: Secondary | ICD-10-CM | POA: Diagnosis not present

## 2021-08-11 DIAGNOSIS — E559 Vitamin D deficiency, unspecified: Secondary | ICD-10-CM | POA: Diagnosis not present

## 2021-08-11 DIAGNOSIS — Z136 Encounter for screening for cardiovascular disorders: Secondary | ICD-10-CM | POA: Diagnosis not present

## 2021-08-11 DIAGNOSIS — F32A Depression, unspecified: Secondary | ICD-10-CM | POA: Diagnosis not present

## 2021-10-31 NOTE — Progress Notes (Signed)
Patient, No Pcp Per (Inactive)   Chief Complaint  Patient presents with   Vaginal Bleeding    Has been bleeding since 10/13/21, no abnormal pain    HPI:      [name] is a 36 y.o. [id number] whose LMP was Patient's last menstrual period was 10/13/2021 (approximate)., presents today for BTB on OCPs since 12/22. Menses are usually monthly, lasting 5 days,  mod flow, no BTB, mild dysmen. Started menses 10/13/21 normally but bleeding never stopped. No increased dysmen/flow. More spotting/light bleeding now. No missed/late pills. Was under increased stress with holidays. Started OCPs for period improvement after TL, doing well.  She is sex active, no pain/bleeding. No new partners. S/p TL.  Neg pap/neg HPV DNA 5/18  Patient Active Problem List   Diagnosis Date Noted   Encounter for maternal care for low transverse scar from previous cesarean delivery 10/13/2017   Anemia complicating pregnancy 07/27/2017   Chronic low back pain without sciatica 04/06/2017   Supervision of high risk pregnancy, antepartum 03/19/2017   History of cesarean delivery 03/19/2017    Past Surgical History:  Procedure Laterality Date   CESAREAN SECTION  2011   CESAREAN SECTION  2015   CESAREAN SECTION  2018   CESAREAN SECTION WITH BILATERAL TUBAL LIGATION Bilateral 10/13/2017   Procedure: CESAREAN SECTION WITH BILATERAL TUBAL LIGATION;  Surgeon: Vena Austria, MD;  Location: [facility] ORS;  Service: Obstetrics;  Laterality: Bilateral;   DILATION AND CURETTAGE OF UTERUS  07/2010   INNER EAR SURGERY Left 2003   LIPOSUCTION  07/2021   TONSILLECTOMY      Family History  Problem Relation Age of Onset   Thyroid disease Mother    Other Father        unknown medical history    Social History   Socioeconomic History   Marital status: Single    Spouse name: Not on file   Number of children: Not on file   Years of education: Not on file   Highest education level: Not on file  Occupational History    Not on file  Tobacco Use   Smoking status: Former    Types: Cigarettes    Quit date: 09/12/2017    Years since quitting: 4.1   Smokeless tobacco: Never  Vaping Use   Vaping Use: Never used  Substance and Sexual Activity   Alcohol use: Yes    Comment: rarely   Drug use: No   Sexual activity: Yes    Birth control/protection: Pill, Surgical    Comment: Tubal Ligation  Other Topics Concern   Not on file  Social History Narrative   Not on file   Social Determinants of Health   Financial Resource Strain: Not on file  Food Insecurity: Not on file  Transportation Needs: Not on file  Physical Activity: Not on file  Stress: Not on file  Social Connections: Not on file  Intimate Partner Violence: Not on file    Outpatient Medications Prior to Visit  Medication Sig Dispense Refill   escitalopram (LEXAPRO) 10 MG tablet Take 10 mg by mouth daily.     NIKKI 3-0.02 MG tablet TAKE 1 TABLET BY MOUTH ONCE A DAY 28 tablet 11   benzonatate (TESSALON) 100 MG capsule Take 1 capsule (100 mg total) by mouth 3 (three) times daily as needed for cough. (Patient not taking: Reported on 12/20/2019) 21 capsule 0   ondansetron (ZOFRAN) 4 MG tablet Take 1 tablet (4 mg total) by mouth every  6 (six) hours. 12 tablet 0   oxyCODONE-acetaminophen (PERCOCET) 5-325 MG tablet Take 1 tablet by mouth every 4 (four) hours as needed for severe pain. 8 tablet 0   promethazine-dextromethorphan (PROMETHAZINE-DM) 6.25-15 MG/5ML syrup Take 5 mLs by mouth 2 (two) times daily as needed for cough. 100 mL 0   terbinafine (LAMISIL) 250 MG tablet Take 1 tablet (250 mg total) by mouth daily. 90 tablet 0   No facility-administered medications prior to visit.      ROS:  Review of Systems  Constitutional:  Negative for fever.  Gastrointestinal:  Negative for blood in stool, constipation, diarrhea, nausea and vomiting.  Genitourinary:  Positive for menstrual problem. Negative for dyspareunia, dysuria, flank pain,  frequency, hematuria, urgency, vaginal bleeding, vaginal discharge and vaginal pain.  Musculoskeletal:  Negative for back pain.  Skin:  Negative for rash.  BREAST: No symptoms   OBJECTIVE:   Vitals:  BP 110/80    Ht 5\' 7"  (1.702 m)    Wt 200 lb (90.7 kg)    LMP 10/13/2021 (Approximate)    BMI 31.32 kg/m   Physical Exam Vitals reviewed.  Constitutional:      Appearance: She is well-developed.  Pulmonary:     Effort: Pulmonary effort is normal.  Genitourinary:    General: Normal vulva.     Pubic Area: No rash.      Labia:        Right: No rash, tenderness or lesion.        Left: No rash, tenderness or lesion.      Vagina: Bleeding present. No vaginal discharge, erythema or tenderness.     Cervix: Normal.     Uterus: Normal. Not enlarged and not tender.      Adnexa: Right adnexa normal and left adnexa normal.       Right: No mass or tenderness.         Left: No mass or tenderness.    Musculoskeletal:        General: Normal range of motion.     Cervical back: Normal range of motion.  Skin:    General: Skin is warm and dry.  Neurological:     General: No focal deficit present.     Mental Status: She is alert and oriented to person, place, and time.  Psychiatric:        Mood and Affect: Mood normal.        Behavior: Behavior normal.        Thought Content: Thought content normal.        Judgment: Judgment normal.    Assessment/Plan: Breakthrough bleeding on OCPs--this cycle only. Check pap, cont OCPs. F/u for further eval with labs/GYN u/s if sx persist next pill pack. Reassurance in meantime, particularly since flow if light, no dysmen.   Cervical cancer screening - Plan: Cytology - PAP  Screening for HPV (human papillomavirus) - Plan: Cytology - PAP     Return if symptoms worsen or fail to improve.  Tymothy Cass B. Zayana Salvador, PA-C 11/03/2021 2:09 PM

## 2021-11-03 ENCOUNTER — Other Ambulatory Visit (HOSPITAL_COMMUNITY)
Admission: RE | Admit: 2021-11-03 | Discharge: 2021-11-03 | Disposition: A | Discharge status: Home / Self Care | Payer: BC Managed Care – PPO | Source: Ambulatory Visit | Attending: Obstetrics and Gynecology | Admitting: Obstetrics and Gynecology

## 2021-11-03 ENCOUNTER — Other Ambulatory Visit: Payer: Self-pay

## 2021-11-03 ENCOUNTER — Ambulatory Visit: Payer: BC Managed Care – PPO | Admitting: Obstetrics and Gynecology

## 2021-11-03 ENCOUNTER — Encounter: Payer: Self-pay | Admitting: Obstetrics and Gynecology

## 2021-11-03 VITALS — BP 110/80 | Ht 67.0 in | Wt 200.0 lb

## 2021-11-03 DIAGNOSIS — Z124 Encounter for screening for malignant neoplasm of cervix: Secondary | ICD-10-CM

## 2021-11-03 DIAGNOSIS — Z1151 Encounter for screening for human papillomavirus (HPV): Secondary | ICD-10-CM

## 2021-11-03 DIAGNOSIS — N921 Excessive and frequent menstruation with irregular cycle: Secondary | ICD-10-CM | POA: Diagnosis not present

## 2021-11-06 LAB — CYTOLOGY - PAP
Comment: NEGATIVE
Diagnosis: NEGATIVE
Diagnosis: REACTIVE
High risk HPV: POSITIVE — AB

## 2022-03-30 ENCOUNTER — Telehealth: Payer: Self-pay

## 2022-03-30 MED ORDER — DROSPIRENONE-ETHINYL ESTRADIOL 3-0.02 MG PO TABS: 1.0000 | ORAL_TABLET | Freq: Every day | ORAL | 0 refills | Status: DC

## 2022-03-30 MED ORDER — DROSPIRENONE-ETHINYL ESTRADIOL 3-0.02 MG PO TABS: 1.0000 | ORAL_TABLET | Freq: Every day | ORAL | 7 refills | Status: DC

## 2022-03-30 NOTE — Telephone Encounter (Signed)
Received a BC refill request from Pharmacy, [facility] refilled as pt has seen Alicia Copland in January for Breakthrough bleeding and was advised to continue OCP

## 2022-05-04 DIAGNOSIS — F339 Major depressive disorder, recurrent, unspecified: Secondary | ICD-10-CM | POA: Diagnosis not present

## 2022-05-04 DIAGNOSIS — F1010 Alcohol abuse, uncomplicated: Secondary | ICD-10-CM | POA: Diagnosis not present

## 2022-05-04 DIAGNOSIS — Z6831 Body mass index (BMI) 31.0-31.9, adult: Secondary | ICD-10-CM | POA: Diagnosis not present
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
## 2022-08-07 DIAGNOSIS — F339 Major depressive disorder, recurrent, unspecified: Secondary | ICD-10-CM | POA: Diagnosis not present

## 2022-08-07 DIAGNOSIS — Z6831 Body mass index (BMI) 31.0-31.9, adult: Secondary | ICD-10-CM | POA: Diagnosis not present

## 2022-08-07 DIAGNOSIS — M12811 Other specific arthropathies, not elsewhere classified, right shoulder: Secondary | ICD-10-CM | POA: Diagnosis not present

## 2022-08-10 DIAGNOSIS — M47812 Spondylosis without myelopathy or radiculopathy, cervical region: Secondary | ICD-10-CM | POA: Diagnosis not present

## 2022-08-10 DIAGNOSIS — S29019A Strain of muscle and tendon of unspecified wall of thorax, initial encounter: Secondary | ICD-10-CM | POA: Insufficient documentation

## 2022-08-12 DIAGNOSIS — M47812 Spondylosis without myelopathy or radiculopathy, cervical region: Secondary | ICD-10-CM | POA: Insufficient documentation

## 2022-08-21 DIAGNOSIS — S29019A Strain of muscle and tendon of unspecified wall of thorax, initial encounter: Secondary | ICD-10-CM | POA: Diagnosis not present

## 2022-08-21 DIAGNOSIS — M47812 Spondylosis without myelopathy or radiculopathy, cervical region: Secondary | ICD-10-CM | POA: Diagnosis not present

## 2022-10-02 DIAGNOSIS — M5412 Radiculopathy, cervical region: Secondary | ICD-10-CM | POA: Diagnosis not present

## 2022-11-12 DIAGNOSIS — J069 Acute upper respiratory infection, unspecified: Secondary | ICD-10-CM | POA: Diagnosis not present

## 2022-11-12 DIAGNOSIS — Z6831 Body mass index (BMI) 31.0-31.9, adult: Secondary | ICD-10-CM | POA: Diagnosis not present

## 2022-12-11 ENCOUNTER — Other Ambulatory Visit: Payer: Self-pay

## 2022-12-11 ENCOUNTER — Other Ambulatory Visit: Payer: Self-pay | Admitting: Obstetrics and Gynecology

## 2022-12-11 DIAGNOSIS — Z3041 Encounter for surveillance of contraceptive pills: Secondary | ICD-10-CM

## 2022-12-11 MED ORDER — DROSPIRENONE-ETHINYL ESTRADIOL 3-0.02 MG PO TABS: 1.0000 | ORAL_TABLET | Freq: Every day | ORAL | 0 refills | Status: DC

## 2022-12-29 ENCOUNTER — Ambulatory Visit (INDEPENDENT_AMBULATORY_CARE_PROVIDER_SITE_OTHER): Payer: BC Managed Care – PPO | Admitting: Obstetrics

## 2022-12-29 ENCOUNTER — Other Ambulatory Visit (HOSPITAL_COMMUNITY)
Admission: RE | Admit: 2022-12-29 | Discharge: 2022-12-29 | Disposition: A | Discharge status: Home / Self Care | Payer: BC Managed Care – PPO | Source: Ambulatory Visit | Attending: Obstetrics | Admitting: Obstetrics

## 2022-12-29 ENCOUNTER — Encounter: Payer: Self-pay | Admitting: Obstetrics

## 2022-12-29 VITALS — BP 116/80 | HR 78 | Resp 15 | Ht 67.0 in | Wt 200.9 lb

## 2022-12-29 DIAGNOSIS — Z113 Encounter for screening for infections with a predominantly sexual mode of transmission: Secondary | ICD-10-CM

## 2022-12-29 DIAGNOSIS — Z01419 Encounter for gynecological examination (general) (routine) without abnormal findings: Secondary | ICD-10-CM

## 2022-12-29 DIAGNOSIS — Z124 Encounter for screening for malignant neoplasm of cervix: Secondary | ICD-10-CM

## 2022-12-29 DIAGNOSIS — N760 Acute vaginitis: Secondary | ICD-10-CM | POA: Insufficient documentation

## 2022-12-29 NOTE — Progress Notes (Signed)
Gynecology Annual Exam   PCP: Denise Houston, No Pcp Per  Chief Complaint:  Chief Complaint  Denise Houston presents with   Annual Exam    History of Present Illness: Denise Houston is a 37 y.o. [id number] presents for annual exam. The Denise Houston has no complaints today.   LMP: Denise Houston's last menstrual period was 12/07/2022 (approximate). Average Interval: regular, 28 days Duration of flow: 3 days Heavy Menses: no Clots: no Intermenstrual Bleeding: no Postcoital Bleeding: no Dysmenorrhea: no She has been on a beneric Yaz to address heavy, painful periods. She would like to try something els- tired of taking OCPs daily. She has had a BTL.  The Denise Houston is not currently sexually active. She currently uses tubal ligation for contraception. She denies dyspareunia.  The Denise Houston does not perform self breast exams.  There is no notable family history of breast or ovarian cancer in her family.  The Denise Houston wears seatbelts: yes.   The Denise Houston has regular exercise: no.    The Denise Houston denies current symptoms of depression.    Review of Systems: ROS  Past Medical History:  Denise Houston Active Problem List   Diagnosis Date Noted   Encounter for maternal care for low transverse scar from previous cesarean delivery [id number]   Anemia complicating pregnancy [id number]    Started iron on 07/27/2017    Chronic low back pain without sciatica 04/06/2017   Supervision of high risk pregnancy, antepartum 03/19/2017    Clinic [facility] Prenatal Labs  Dating LMP = 12 week [place] Blood type: O/Positive/-- (05/25 1036)   Genetic Screen Declines Antibody:Negative (05/25 1036)  Anatomic [place] Normal Female Rubella: 1.68 (05/25 1036) Varicella: Immune  GTT Third trimester: 134  RPR: Non Reactive (05/25 1036)   Rhogam N/A HBsAg: Negative (05/25 1036)   TDaP vaccine 08/11/17  Flu Shot: 08/11/17 HIV: Non Reactive (05/25 1036)   Baby Food   Bottle                             [id number]  Contraception  Tubal Pap: 03/24/2017 NIL HPV  negative  CBB     CS/VBAC Desires c/s       History of cesarean delivery 03/19/2017    Past Surgical History:  Past Surgical History:  Procedure Laterality Date   CESAREAN SECTION  2011   CESAREAN SECTION  2015   CESAREAN SECTION  2018   CESAREAN SECTION WITH BILATERAL TUBAL LIGATION Bilateral 10/13/2017   Procedure: CESAREAN SECTION WITH BILATERAL TUBAL LIGATION;  Surgeon: Malachy Mood, MD;  Location: [facility] ORS;  Service: Obstetrics;  Laterality: Bilateral;   DILATION AND CURETTAGE OF UTERUS  07/2010   INNER EAR SURGERY Left 2003   LIPOSUCTION  07/2021   TONSILLECTOMY      Gynecologic History:  Denise Houston's last menstrual period was 12/07/2022 (approximate). Contraception: tubal ligation Last Pap: Results were: NIL and HR HPV+   Obstetric History: [id number]  Family History:  Family History  Problem Relation Age of Onset   Thyroid disease Mother    Other Father        unknown medical history    Social History:  Social History   Socioeconomic History   Marital status: Single    Spouse name: Not on file   Number of children: Not on file   Years of education: Not on file   Highest education level: Not on file  Occupational History   Not on file  Tobacco Use   Smoking status:  Former    Types: Cigarettes    Quit date: 09/12/2017    Years since quitting: 5.2   Smokeless tobacco: Never  Vaping Use   Vaping Use: Never used  Substance and Sexual Activity   Alcohol use: Yes    Comment: rarely   Drug use: No   Sexual activity: Yes    Birth control/protection: Pill, Surgical    Comment: Tubal Ligation  Other Topics Concern   Not on file  Social History Narrative   Not on file   Social Determinants of Health   Financial Resource Strain: Not on file  Food Insecurity: Not on file  Transportation Needs: Not on file  Physical Activity: Not on file  Stress: Not on file  Social Connections: Not on file  Intimate Partner Violence: Not on file    Allergies:   No Known Allergies  Medications: Prior to Admission medications   Medication Sig Start Date End Date Taking? Authorizing Provider  drospirenone-ethinyl estradiol (NIKKI) 3-0.02 MG tablet Take 1 tablet by mouth daily. 12/11/22   Imagene Riches, CNM  escitalopram (LEXAPRO) 10 MG tablet Take 10 mg by mouth daily. 10/15/21   [provider]    Physical Exam Vitals: Blood pressure 116/80, pulse 78, resp. rate 15, height '5\' 7"'$  (1.702 m), weight 200 lb 14.4 oz (91.1 kg), last menstrual period 12/07/2022.  General: NAD HEENT: normocephalic, anicteric Thyroid: no enlargement, no palpable nodules Pulmonary: No increased work of breathing, CTAB Cardiovascular: RRR, distal pulses 2+ Breast: Breast symmetrical, no tenderness, no palpable nodules or masses, no skin or nipple retraction present, no nipple discharge.  No axillary or supraclavicular lymphadenopathy. Abdomen: NABS, soft, non-tender, non-distended.  Umbilicus without lesions.  No hepatomegaly, splenomegaly or masses palpable. No evidence of hernia  Genitourinary:  External: Normal external female genitalia.  Normal urethral meatus, normal Bartholin's and Skene's glands.    Vagina: Normal vaginal mucosa, no evidence of prolapse.    Cervix: Grossly normal in appearance, no bleeding  Uterus: Non-enlarged, mobile, normal contour.  No CMT  Adnexa: ovaries non-enlarged, no adnexal masses  Rectal: deferred  Lymphatic: no evidence of inguinal lymphadenopathy Extremities: no edema, erythema, or tenderness Neurologic: Grossly intact Psychiatric: mood appropriate, affect full  Female chaperone present for pelvic and breast  portions of the physical exam    Assessment: 37 y.o. [id number] routine annual exam  Plan: Problem List Items Addressed This Visit   None Visit Diagnoses     Cervical cancer screening    -  Primary   Relevant Orders   Cytology - PAP   Women's annual routine gynecological examination       Relevant Orders    HEP, RPR, HIV Panel   Screen for STD (sexually transmitted disease)       Relevant Orders   HEP, RPR, HIV Panel       2) STI screening  wasoffered and accepted. She is also having blood work for STI screen as well.  2)  ASCCP guidelines and rational discussed.  Denise Houston opts for yearly screening interval  3) Contraception - the Denise Houston is currently using  tubal ligation.  She is happy with her current form of contraception and plans to continue she is interested in discussing ablation with an MD. I have provided information on this procedure and she will set up an appt to discuss with Dr. Marcelline Mates.  4) Routine healthcare maintenance including cholesterol, diabetes screening discussed managed by PCP  5) Return for consultation with [facility] for ablation vx IUD.  Imagene Riches, CNM  12/29/2022 2:19 PM   [facility] 12/29/2022, 2:19 PM

## 2022-12-29 NOTE — Addendum Note (Signed)
Addended by: Minette Headland on: 12/29/2022 02:26 PM   Modules accepted: Orders

## 2022-12-30 ENCOUNTER — Other Ambulatory Visit: Payer: Self-pay | Admitting: Obstetrics

## 2022-12-30 ENCOUNTER — Encounter: Payer: Self-pay | Admitting: Obstetrics

## 2022-12-30 DIAGNOSIS — B9689 Other specified bacterial agents as the cause of diseases classified elsewhere: Secondary | ICD-10-CM

## 2022-12-30 LAB — CERVICOVAGINAL ANCILLARY ONLY
Bacterial Vaginitis (gardnerella): POSITIVE — AB
Candida Glabrata: NEGATIVE
Candida Vaginitis: NEGATIVE
Chlamydia: NEGATIVE
Comment: NEGATIVE
Comment: NEGATIVE
Comment: NEGATIVE
Comment: NEGATIVE
Comment: NEGATIVE
Comment: NORMAL
Neisseria Gonorrhea: NEGATIVE
Trichomonas: NEGATIVE

## 2022-12-30 LAB — HEP, RPR, HIV PANEL
HIV Screen 4th Generation wRfx: NONREACTIVE
Hepatitis B Surface Ag: NEGATIVE
RPR Ser Ql: NONREACTIVE

## 2022-12-30 MED ORDER — METRONIDAZOLE 500 MG PO TABS: 500.0000 mg | ORAL_TABLET | Freq: Two times a day (BID) | ORAL | 0 refills | Status: AC

## 2022-12-30 NOTE — Progress Notes (Signed)
Patient seen for her Well Woman Exam yesterday.Aptima swab shows BV. Rx for Flagyl sent to pharmacy, and she is notified via [facility].  Imagene Riches, CNM  12/30/2022 9:19 PM

## 2023-01-05 LAB — CYTOLOGY - PAP
Comment: NEGATIVE
Diagnosis: NEGATIVE
High risk HPV: NEGATIVE

## 2023-01-08 ENCOUNTER — Other Ambulatory Visit: Payer: Self-pay | Admitting: Obstetrics

## 2023-01-08 DIAGNOSIS — Z3041 Encounter for surveillance of contraceptive pills: Secondary | ICD-10-CM

## 2023-02-18 NOTE — Progress Notes (Unsigned)
GYNECOLOGY PROGRESS NOTE  Subjective:    Patient ID: Denise Houston, female    DOB: Mar 30, 1986, 37 y.o.   MRN: [id number]  HPI  Patient is a 37 y.o. [id number] female who presents for consultation for Ablation vs IUD. Referred from Paula Compton, CNM.  Has h/o heavy cycles however after having BTL they became heavier and more painful.  Has been taking combined OCPs to manage but would like an alternative that does not require a regimen to maintain. Changes pads/tampons every 4 hours (however prior to OCPs was having to use both at the same time).   Has been off birth control for ~ 2 months due to issue with the pharmacy. Previously regular cycles prior to this on OCPs.     OB History  Gravida Para Term Preterm AB Living  5 3 3   2 3   SAB IAB Ectopic Multiple Live Births  2     0 3    # Outcome Date GA Lbr Len/2nd Weight Sex Delivery Anes PTL Lv  5 Term 10/13/17 [redacted]w[redacted]d  6 lb 14.8 oz (3.14 kg) M CS-LTranv Spinal  LIV  4 Term 01/04/14   8 lb 15 oz (4.054 kg)  CS-LTranv   LIV  3 Term 11/09/09   6 lb 15 oz (3.147 kg) F CS-LTranv   LIV  2 SAB           1 SAB             Past Medical History:  Diagnosis Date   Anemia    Chronic lower back pain    GERD (gastroesophageal reflux disease)    Family History  Problem Relation Age of Onset   Thyroid disease Mother    Other Father        unknown medical history    Past Surgical History:  Procedure Laterality Date   CESAREAN SECTION  2011   CESAREAN SECTION  2015   CESAREAN SECTION  2018   CESAREAN SECTION WITH BILATERAL TUBAL LIGATION Bilateral 10/13/2017   Procedure: CESAREAN SECTION WITH BILATERAL TUBAL LIGATION;  Surgeon: Vena Austria, MD;  Location: [facility] ORS;  Service: Obstetrics;  Laterality: Bilateral;   DILATION AND CURETTAGE OF UTERUS  07/2010   INNER EAR SURGERY Left 2003   LIPOSUCTION  07/2021   TONSILLECTOMY      Social History   Socioeconomic History   Marital status: Single    Spouse name: Not on file    Number of children: Not on file   Years of education: Not on file   Highest education level: Not on file  Occupational History   Not on file  Tobacco Use   Smoking status: Former    Types: Cigarettes    Quit date: 09/12/2017    Years since quitting: 5.4   Smokeless tobacco: Never  Vaping Use   Vaping Use: Never used  Substance and Sexual Activity   Alcohol use: Yes    Comment: rarely   Drug use: No   Sexual activity: Yes    Birth control/protection: Pill, Surgical    Comment: Tubal Ligation  Other Topics Concern   Not on file  Social History Narrative   Not on file   Social Determinants of Health   Financial Resource Strain: Not on file  Food Insecurity: Not on file  Transportation Needs: Not on file  Physical Activity: Not on file  Stress: Not on file  Social Connections: Not on file  Intimate Partner Violence: Not on  file    Current Outpatient Medications on File Prior to Visit  Medication Sig Dispense Refill   escitalopram (LEXAPRO) 10 MG tablet Take 10 mg by mouth daily.     No current facility-administered medications on file prior to visit.     Review of Systems Pertinent items noted in HPI and remainder of comprehensive ROS otherwise negative.   Objective:   Blood pressure 124/87, pulse 85, resp. rate 16, height 5\' 7"  (1.702 m), weight 205 lb 6.4 oz (93.2 kg), last menstrual period 01/28/2023. Body mass index is 32.17 kg/m. General appearance: alert and no distress Abdomen: soft, non-tender; bowel sounds normal; no masses,  no organomegaly Pelvic: external genitalia normal, rectovaginal septum normal.  Vagina with scant dark red blood in vaginal vault, no discharge.  Cervix normal appearing, no lesions and no motion tenderness.  Uterus mobile, nontender, normal shape and size.  Adnexae non-palpable, nontender bilaterally.  Extremities: extremities normal, atraumatic, no cyanosis or edema Neurologic: Grossly normal   Assessment:   1. Encounter for  insertion of mirena IUD   2. Menorrhagia with regular cycle   3. History of bilateral tubal ligation      Plan:   Discussion had with patient regarding her symptoms and desires for alternative management options of endometrial ablation versus IUD.  I discussed the risks and benefits of both methods.  Advised that endometrial ablation could indeed lead to no periods however average span of results can vary between 5 to 10 years.  Advised that this patient was still remote from menopause she could return with bleeding symptoms again during that time and could again require intervention.  Also discussed the risk of post ablation syndrome and pelvic pain due to her history of tubal ligation.  I reviewed benefits of IUD lasting similar length of time with similar rates of amenorrhea, however discussed risks including abnormal bleeding with IUD and uterine perforation.  Distress the fact that effects of an IUD can be reversed if undesired.  After discussion of all options patient notes that she will try the IUD first.  Does note that she had a history of an IUD causing pain which required removal almost 15 years ago,however is willing to try again.  Offered IUD insertion today versus returning for a separate appointment, patient okay with insertion today.  See procedure note below. Patient is status post BTL, however has had no menstrual cycle since stopping her OCPs 2 months ago.  UPT performed today to rule out possible pregnancy, was negative.     GYNECOLOGY OFFICE PROCEDURE NOTE  Denise Houston is a 37 y.o. [id number] here for Mierna IUD insertion.   Last pap smear was on 12/29/2022 and was normal.  IUD Insertion Procedure Note Patient identified, informed consent performed, consent signed.   Discussed risks of irregular bleeding, cramping, infection, malpositioning or misplacement of the IUD outside the uterus which may require further procedure such as laparoscopy. Also discussed >99% contraception  efficacy, increased risk of ectopic pregnancy with failure of method.   Emphasized that this did not protect against STIs, condoms recommended during all sexual encounters. Time out was performed.  Urine pregnancy test negative.  Speculum placed in the vagina.  Cervix visualized.  Cleaned with Betadine x 2.  Grasped anteriorly with a single tooth tenaculum.  Cervix was dilated to accommodate the IUD apparatus.  The uterus was sounded to 9 cm.  Mirena IUD placed per manufacturer's recommendations.  Strings trimmed to 3 cm. Tenaculum was removed, good hemostasis noted.  Patient tolerated procedure well.  Given 600 mg of ibuprofen postprocedure.  Patient was given post-procedure instructions.    Patient was also asked to check IUD strings periodically and follow up in 4 weeks for IUD check.   Lot: [id number] Exp: 02/2025.    A total of 28 minutes were spent during this encounter, including review of previous progress notes, recent imaging and labs, face-to-face with time with patient involving counseling and coordination of care, as well as documentation for current visit.  Hildred Laser, MD [facility]

## 2023-02-19 ENCOUNTER — Ambulatory Visit: Payer: BC Managed Care – PPO | Admitting: Obstetrics and Gynecology

## 2023-02-19 VITALS — BP 124/87 | HR 85 | Resp 16 | Ht 67.0 in | Wt 205.4 lb

## 2023-02-19 DIAGNOSIS — Z3009 Encounter for other general counseling and advice on contraception: Secondary | ICD-10-CM | POA: Diagnosis not present

## 2023-02-19 DIAGNOSIS — Z30430 Encounter for insertion of intrauterine contraceptive device: Secondary | ICD-10-CM | POA: Diagnosis not present

## 2023-02-19 DIAGNOSIS — Z9851 Tubal ligation status: Secondary | ICD-10-CM | POA: Diagnosis not present

## 2023-02-19 DIAGNOSIS — Z3202 Encounter for pregnancy test, result negative: Secondary | ICD-10-CM

## 2023-02-19 DIAGNOSIS — N920 Excessive and frequent menstruation with regular cycle: Secondary | ICD-10-CM | POA: Diagnosis not present

## 2023-02-19 LAB — POCT URINE PREGNANCY: Preg Test, Ur: NEGATIVE

## 2023-02-19 MED ORDER — LEVONORGESTREL 20 MCG/DAY IU IUD
1.0000 | INTRAUTERINE_SYSTEM | Freq: Once | INTRAUTERINE | Status: AC
  Administered 2023-02-19: 1 via INTRAUTERINE

## 2023-02-19 NOTE — Patient Instructions (Signed)

## 2023-02-21 ENCOUNTER — Encounter: Payer: Self-pay | Admitting: Obstetrics and Gynecology

## 2023-02-22 ENCOUNTER — Encounter: Payer: Self-pay | Admitting: Obstetrics and Gynecology

## 2023-03-17 NOTE — Progress Notes (Unsigned)
    GYNECOLOGY OFFICE ENCOUNTER NOTE  History:  37 y.o. [id number] here today for today for IUD string check; Mirena  IUD was placed  02/19/2023. Patient reports she has been bleeding daily since 02/22/23.  Initially was heavy, now is becoming lighter over the past 3-4 days. Also has had cramping on and off since insertion.     The following portions of the patient's history were reviewed and updated as appropriate: allergies, current medications, past family history, past medical history, past social history, past surgical history and problem list. Last pap smear on 12/29/2022 was normal, negative HRHPV.  Review of Systems:  Pertinent items are noted in HPI.  Objective:  Physical Exam Blood pressure 115/83, pulse 77, height 5\' 7"  (1.702 m), weight 203 lb 3.2 oz (92.2 kg), last menstrual period 02/22/2023. Body mass index is 31.83 kg/m.  CONSTITUTIONAL: Well-developed, well-nourished female in no acute distress.  NEUROLOGIC: Alert and oriented to person, place, and time. Normal reflexes, muscle tone coordination.  ABDOMEN: Soft, no distention noted.   PELVIC: Normal appearing external genitalia; normal appearing vaginal mucosa and cervix.  IUD strings visualized, about 3 cm in length outside cervix. Done in the presence of a chaperone.  EXTREMITIES: Non-tender, no edema or cyanosis  Assessment & Plan:  - Patient to keep IUD in place for up to 8 years; can come in for removal if she desires pregnancy earlier or for any concerning side effects. - Discussed management options for persistent bleeding including use of NSAIDs, supplemental estrogen, 1 month of OCP use. Patient ok with NSAIDs for a few days. If still no relief, given sample pack of Nextellis to take for 1 month.   Hildred Laser, MD [facility]

## 2023-03-18 ENCOUNTER — Ambulatory Visit (INDEPENDENT_AMBULATORY_CARE_PROVIDER_SITE_OTHER): Payer: BC Managed Care – PPO | Admitting: Obstetrics and Gynecology

## 2023-03-18 ENCOUNTER — Encounter: Payer: Self-pay | Admitting: Obstetrics and Gynecology

## 2023-03-18 VITALS — BP 115/83 | HR 77 | Ht 67.0 in | Wt 203.2 lb

## 2023-03-18 DIAGNOSIS — N921 Excessive and frequent menstruation with irregular cycle: Secondary | ICD-10-CM

## 2023-03-18 DIAGNOSIS — Z30431 Encounter for routine checking of intrauterine contraceptive device: Secondary | ICD-10-CM

## 2023-03-18 DIAGNOSIS — Z975 Presence of (intrauterine) contraceptive device: Secondary | ICD-10-CM

## 2023-08-05 ENCOUNTER — Ambulatory Visit: Payer: BC Managed Care – PPO | Admitting: Obstetrics and Gynecology

## 2023-08-05 ENCOUNTER — Encounter: Payer: Self-pay | Admitting: Obstetrics and Gynecology

## 2023-08-05 VITALS — BP 120/86 | HR 83 | Resp 16 | Ht 67.0 in | Wt 206.6 lb

## 2023-08-05 DIAGNOSIS — Z30432 Encounter for removal of intrauterine contraceptive device: Secondary | ICD-10-CM

## 2023-08-05 DIAGNOSIS — N920 Excessive and frequent menstruation with regular cycle: Secondary | ICD-10-CM

## 2023-08-05 DIAGNOSIS — N938 Other specified abnormal uterine and vaginal bleeding: Secondary | ICD-10-CM

## 2023-08-05 DIAGNOSIS — Z30011 Encounter for initial prescription of contraceptive pills: Secondary | ICD-10-CM

## 2023-08-05 MED ORDER — DROSPIRENONE-ETHINYL ESTRADIOL 3-0.02 MG PO TABS: 1.0000 | ORAL_TABLET | Freq: Every day | ORAL | 3 refills | Status: DC

## 2023-08-05 NOTE — Progress Notes (Signed)
    GYNECOLOGY OFFICE PROCEDURE NOTE  Denise Houston is a 37 y.o. [phone] here for Mirena IUD removal. IUD was inserted ~ 4 months ago. Notes that she has been having moderate pain since insertion. Pain initially started on her left side, and now also is radiating to her right.  Last pap smear was on 12/29/2022 and was normal. States that she desires to resume the conversation of having an endometrial ablation.   IUD Removal  Patient identified, informed consent performed, consent signed.  Patient was in the dorsal lithotomy position, normal external genitalia was noted.  A speculum was placed in the patient's vagina, normal discharge was noted, no lesions. The cervix was visualized, no lesions, no abnormal discharge.  The strings of the IUD were grasped and pulled using ring forceps. The IUD was removed in its entirety.  Patient tolerated the procedure well.    Patient will use continuous OCPs for contraception for now, however would like to proceed with endometrial ablation. Discussed need for further workup of her bleeding with ultrasound and endometrial biopsy prior to procedure. Patient notes understanding. Given handout on endometrial ablation. Will f/u in 3-4 weeks for ultrasound, EMB and possible preop.    Hildred Laser, MD [facility]

## 2023-09-02 ENCOUNTER — Ambulatory Visit: Admission: RE | Admit: 2023-09-02 | Payer: BC Managed Care – PPO | Source: Ambulatory Visit

## 2023-09-06 ENCOUNTER — Ambulatory Visit: Payer: BC Managed Care – PPO

## 2023-09-06 DIAGNOSIS — N938 Other specified abnormal uterine and vaginal bleeding: Secondary | ICD-10-CM

## 2023-09-06 NOTE — Progress Notes (Unsigned)
GYNECOLOGY PREOPERATIVE HISTORY AND PHYSICAL   Subjective:  Denise Houston is a 37 y.o. [id number] here for preoperative exam for surgical management of dysfunctional uterine bleeding.  Recently had IUD removed due to continued bleeding and pelvic pain (kept in place for ~ 4 months).  Now currently on continuous OCPs but does not desire to use long-term.  Also presents today for final workup prior to procedure (endometrial biopsy).   Patient also desires to be screened for STDs. Notes that her partner had concerns about possible exposure and symptoms, however patient denies exhibiting any symptoms or history of STIs. Desires to be screened today.  Declines serology testing.   Proposed surgery: Hysteroscopy D&C with endometrial ablation    Pertinent Gynecological History: Menses:  unsure LMP due to continuous OCP use.  Bleeding: intermenstrual bleeding Contraception:  continuous OCPs and tubal ligation Last pap: normal Date: 12/29/2022   Past Medical History:  Diagnosis Date   Anemia    Chronic lower back pain    GERD (gastroesophageal reflux disease)     Past Surgical History:  Procedure Laterality Date   CESAREAN SECTION  2011   CESAREAN SECTION  2015   CESAREAN SECTION WITH BILATERAL TUBAL LIGATION Bilateral 10/13/2017   Procedure: CESAREAN SECTION WITH BILATERAL TUBAL LIGATION;  Surgeon: Vena Austria, MD;  Location: [facility] ORS;  Service: Obstetrics;  Laterality: Bilateral;   DILATION AND CURETTAGE OF UTERUS  07/2010   INNER EAR SURGERY Left 2003   LIPOSUCTION  07/2021   TONSILLECTOMY      OB History  Gravida Para Term Preterm AB Living  5 3 3   2 3   SAB IAB Ectopic Multiple Live Births  2     0 3    # Outcome Date GA Lbr Len/2nd Weight Sex Type Anes PTL Lv  5 Term 10/13/17 [id number]  6 lb 14.8 oz (3.14 kg) M CS-LTranv Spinal  LIV  4 Term 01/04/14   8 lb 15 oz (4.054 kg)  CS-LTranv   LIV  3 Term 11/09/09   6 lb 15 oz (3.147 kg) F CS-LTranv   LIV  2 SAB           1  SAB             Family History  Problem Relation Age of Onset   Thyroid disease Mother    Other Father        unknown medical history    Social History   Socioeconomic History   Marital status: Single    Spouse name: Not on file   Number of children: Not on file   Years of education: Not on file   Highest education level: Not on file  Occupational History   Not on file  Tobacco Use   Smoking status: Former    Current packs/day: 0.00    Types: Cigarettes    Quit date: 09/12/2017    Years since quitting: 5.9   Smokeless tobacco: Never  Vaping Use   Vaping status: Never Used  Substance and Sexual Activity   Alcohol use: Yes    Comment: rarely   Drug use: No   Sexual activity: Yes    Birth control/protection: Pill, Surgical    Comment: Tubal Ligation  Other Topics Concern   Not on file  Social History Narrative   Not on file   Social Determinants of Health   Financial Resource Strain: Not on file  Food Insecurity: Not on file  Transportation Needs: Not on  file  Physical Activity: Not on file  Stress: Not on file  Social Connections: Not on file  Intimate Partner Violence: Not on file   Current Outpatient Medications on File Prior to Visit  Medication Sig Dispense Refill   drospirenone-ethinyl estradiol (YAZ) 3-0.02 MG tablet Take 1 tablet by mouth daily. Take pills continuously, skip placebo pills. 112 tablet 3   escitalopram (LEXAPRO) 10 MG tablet Take 10 mg by mouth daily.     No current facility-administered medications on file prior to visit.   No Known Allergies    Review of Systems Constitutional: No recent fever/chills/sweats Respiratory: No recent cough/bronchitis Cardiovascular: No chest pain Gastrointestinal: No recent nausea/vomiting/diarrhea Genitourinary: No UTI symptoms Hematologic/lymphatic:No history of coagulopathy or recent blood thinner use    Objective:   Blood pressure (!) 128/92, pulse (!) 101, height 5\' 7"  (1.702 m), weight  200 lb 1.6 oz (90.8 kg). CONSTITUTIONAL: Well-developed, well-nourished female in no acute distress.  HENT:  Normocephalic, atraumatic, External right and left ear normal. Oropharynx is clear and moist EYES: Conjunctivae and EOM are normal. Pupils are equal, round, and reactive to light. No scleral icterus.  NECK: Normal range of motion, supple, no masses SKIN: Skin is warm and dry. No rash noted. Not diaphoretic. No erythema. No pallor. NEUROLOGIC: Alert and oriented to person, place, and time. Normal reflexes, muscle tone coordination. No cranial nerve deficit noted. PSYCHIATRIC: Normal mood and affect. Normal behavior. Normal judgment and thought content. CARDIOVASCULAR: Normal heart rate noted, regular rhythm RESPIRATORY: Effort and breath sounds normal, no problems with respiration noted ABDOMEN: Soft, nontender, nondistended. PELVIC: Deferred MUSCULOSKELETAL: Normal range of motion. No edema and no tenderness. 2+ distal pulses.    Labs: No results found for this or any previous visit (from the past 336 hour(s)).   Imaging Studies: Patient Name: Denise Houston DOB: December 18, 1985 MRN: [id number] LMP:    ULTRASOUND REPORT   Location: [place] [facility] Date of Service: 09/06/2023    Indications: dysfunctional uterine bleeding Findings:  The uterus is anteverted and measures 9.4 x 5.7 x 4.3 cm with a uterine volume of 123.4. Echo texture is heterogenous without evidence of focal masses. Cesarean section scar is prominent   The Endometrium measures 5.7 mm.   Right Ovary measures 1.8 x 1.6 x 1.0 cm. It is normal in appearance. Left Ovary measures 2.5x 2.7 x 1.4 cm. It is normal in appearance. Survey of the adnexa demonstrates no adnexal masses. There is no free fluid in the cul de sac.   Impression: 1. unremarkable   Recommendations: 1.Clinical correlation with the patient's History and Physical Exam.     Katheran Awe    Assessment:   Dysfunctional  uterine bleeding STD screening   Plan:   Counseling: Procedure, risks, reasons, benefits and complications (including injury to bowel, bladder, major blood vessel, ureter, bleeding, possibility of transfusion, infection, or fistula formation) reviewed in detail. Also discussed the risk of post-ablation syndrome due to history of tubal ligation. Likelihood of success in alleviating the patient's condition was discussed. Routine postoperative instructions will be reviewed with the patient and her family in detail after surgery.  The patient concurred with the proposed plan, giving informed written consent for the surgery.   Preop testing ordered. STD screening performed at patient's request.  Instructions reviewed, including NPO after midnight.      Endometrial Biopsy Procedure Note  The patient is positioned on the exam table in the dorsal lithotomy position. Bimanual exam confirms uterine position and size.  A Graves speculum is placed into the vagina. A single toothed tenaculum is placed onto the anterior lip of the cervix. The pipette is placed into the endocervical canal and is advanced to the uterine fundus. Using a piston like technique, with vacuum created by withdrawing the stylus, the endometrial specimen is obtained and transferred to the biopsy container. Minimal bleeding is encountered. The procedure is well tolerated.   Uterine Position: anterior    Uterine Length:  9 cm   Uterine Specimen: Average   Post procedure instructions are given. The patient is scheduled for follow up appointment.   A total of 25 minutes were spent face-to-face with the patient during this encounter and over half of that time involved counseling and coordination of care.   Hildred Laser, MD [facility]

## 2023-09-07 ENCOUNTER — Encounter: Payer: Self-pay | Admitting: Obstetrics and Gynecology

## 2023-09-07 ENCOUNTER — Ambulatory Visit: Payer: BC Managed Care – PPO | Admitting: Obstetrics and Gynecology

## 2023-09-07 ENCOUNTER — Other Ambulatory Visit (HOSPITAL_COMMUNITY)
Admission: RE | Admit: 2023-09-07 | Discharge: 2023-09-07 | Disposition: A | Discharge status: Home / Self Care | Payer: BC Managed Care – PPO | Source: Ambulatory Visit | Attending: Obstetrics and Gynecology | Admitting: Obstetrics and Gynecology

## 2023-09-07 VITALS — BP 128/92 | HR 101 | Ht 67.0 in | Wt 200.1 lb

## 2023-09-07 DIAGNOSIS — Z01818 Encounter for other preprocedural examination: Secondary | ICD-10-CM

## 2023-09-07 DIAGNOSIS — R8769 Abnormal cytological findings in specimens from other female genital organs: Secondary | ICD-10-CM

## 2023-09-07 DIAGNOSIS — N938 Other specified abnormal uterine and vaginal bleeding: Secondary | ICD-10-CM | POA: Insufficient documentation

## 2023-09-07 DIAGNOSIS — Z113 Encounter for screening for infections with a predominantly sexual mode of transmission: Secondary | ICD-10-CM | POA: Insufficient documentation

## 2023-09-07 NOTE — Patient Instructions (Signed)
GYNECOLOGY PRE-OPERATIVE INSTRUCTIONS  You are scheduled for surgery on 09/27/2023.  The name of your procedure is: Hysteroscopy D&C with Endometrial Ablation.   Please read through these instructions carefully regarding preparation for your surgery: Nothing to eat after midnight on the day prior to surgery.  Do not take any medications unless recommended by your provider on day prior to surgery.  Do not take NSAIDs (Motrin, Aleve) or aspirin 7 days prior to surgery.  You may take Tylenol products for minor aches and pains.  You will receive a prescription for pain medications post-operatively.  You will be contacted by phone approximately 1-2 weeks prior to surgery to schedule your pre-operative appointment.  You will need someone to bring you to and from the hospital. You will not be allowed to drive after your procedure.   Please call the office if you have any questions regarding your upcoming surgery, or need to reschedule.    Thank you for choosing [facility].

## 2023-09-09 ENCOUNTER — Other Ambulatory Visit: Payer: Self-pay | Admitting: Obstetrics and Gynecology

## 2023-09-09 ENCOUNTER — Encounter: Payer: Self-pay | Admitting: Obstetrics and Gynecology

## 2023-09-09 DIAGNOSIS — N760 Acute vaginitis: Secondary | ICD-10-CM

## 2023-09-09 LAB — CERVICOVAGINAL ANCILLARY ONLY
Bacterial Vaginitis (gardnerella): POSITIVE — AB
Chlamydia: NEGATIVE
Comment: NEGATIVE
Comment: NEGATIVE
Comment: NEGATIVE
Comment: NORMAL
Neisseria Gonorrhea: NEGATIVE
Trichomonas: NEGATIVE

## 2023-09-09 MED ORDER — METRONIDAZOLE 500 MG PO TABS: 500.0000 mg | ORAL_TABLET | Freq: Two times a day (BID) | ORAL | 0 refills | Status: DC

## 2023-09-10 LAB — SURGICAL PATHOLOGY

## 2023-09-17 ENCOUNTER — Encounter
Admission: RE | Admit: 2023-09-17 | Discharge: 2023-09-17 | Disposition: A | Discharge status: Home / Self Care | Payer: BC Managed Care – PPO | Source: Ambulatory Visit | Attending: Obstetrics and Gynecology | Admitting: Obstetrics and Gynecology

## 2023-09-17 ENCOUNTER — Other Ambulatory Visit: Payer: Self-pay

## 2023-09-17 DIAGNOSIS — Z01812 Encounter for preprocedural laboratory examination: Secondary | ICD-10-CM

## 2023-09-17 HISTORY — DX: Anxiety disorder, unspecified: F41.9

## 2023-09-17 HISTORY — DX: Depression, unspecified: F32.A

## 2023-09-17 NOTE — Patient Instructions (Addendum)
Your procedure is scheduled on: 09/27/23 - Monday Report to the Registration Desk on the 1st floor of the [facility]. To find out your arrival time, please call [phone] between 1PM - 3PM on: 09/24/23 - Friday If your arrival time is 6:00 am, do not arrive before that time as the Medical Mall entrance doors do not open until 6:00 am.  REMEMBER: Instructions that are not followed completely may result in serious medical risk, up to and including death; or upon the discretion of your surgeon and anesthesiologist your surgery may need to be rescheduled.  Do not eat food or drink any liquids after midnight the night before surgery.  No gum chewing or hard candies.   One week prior to surgery: Stop Anti-inflammatories (NSAIDS) such as Advil, Aleve, Ibuprofen, Motrin, Naproxen, Naprosyn and Aspirin based products such as Excedrin, Goody's Powder, BC Powder. You may take Tylenol if needed for pain up until the day of surgery.  Stop ANY OVER THE COUNTER supplements until after surgery.  ON THE DAY OF SURGERY ONLY TAKE THESE MEDICATIONS WITH SIPS OF WATER:  none   No Alcohol for 24 hours before or after surgery.  No Smoking including e-cigarettes for 24 hours before surgery.  No chewable tobacco products for at least 6 hours before surgery.  No nicotine patches on the day of surgery.  Do not use any "recreational" drugs for at least a week (preferably 2 weeks) before your surgery.  Please be advised that the combination of cocaine and anesthesia may have negative outcomes, up to and including death. If you test positive for cocaine, your surgery will be cancelled.  On the morning of surgery brush your teeth with toothpaste and water, you may rinse your mouth with mouthwash if you wish. Do not swallow any toothpaste or mouthwash.  Do not wear jewelry, make-up, hairpins, clips or nail polish.  For welded (permanent) jewelry: bracelets, anklets, waist bands, etc.  Please have this  removed prior to surgery.  If it is not removed, there is a chance that hospital personnel will need to cut it off on the day of surgery.  Do not wear lotions, powders, or perfumes.   Do not shave body hair from the neck down 48 hours before surgery.  Contact lenses, hearing aids and dentures may not be worn into surgery.  Do not bring valuables to the hospital. [facility] is not responsible for any missing/lost belongings or valuables.   Notify your doctor if there is any change in your medical condition (cold, fever, infection).  Wear comfortable clothing (specific to your surgery type) to the hospital.  After surgery, you can help prevent lung complications by doing breathing exercises.  Take deep breaths and cough every 1-2 hours. Your doctor may order a device called an Incentive Spirometer to help you take deep breaths. When coughing or sneezing, hold a pillow firmly against your incision with both hands. This is called "splinting." Doing this helps protect your incision. It also decreases belly discomfort.  If you are being admitted to the hospital overnight, leave your suitcase in the car. After surgery it may be brought to your room.  In case of increased patient census, it may be necessary for you, the patient, to continue your postoperative care in the Same Day Surgery department.  If you are being discharged the day of surgery, you will not be allowed to drive home. You will need a responsible individual to drive you home and stay with you for  24 hours after surgery.   If you are taking public transportation, you will need to have a responsible individual with you.  Please call the Pre-admissions Testing Dept. at [phone] if you have any questions about these instructions.  Surgery Visitation Policy:  Patients having surgery or a procedure may have two visitors.  Children under the age of 86 must have an adult with them who is not the patient.  Inpatient  Visitation:    Visiting hours are 7 a.m. to 8 p.m. Up to four visitors are allowed at one time in a patient room. The visitors may rotate out with other people during the day.  One visitor age 34 or older may stay with the patient overnight and must be in the room by 8 p.m.

## 2023-09-20 ENCOUNTER — Encounter
Admission: RE | Admit: 2023-09-20 | Discharge: 2023-09-20 | Disposition: A | Discharge status: Home / Self Care | Payer: BC Managed Care – PPO | Source: Ambulatory Visit | Attending: Obstetrics and Gynecology | Admitting: Obstetrics and Gynecology

## 2023-09-20 DIAGNOSIS — N938 Other specified abnormal uterine and vaginal bleeding: Secondary | ICD-10-CM | POA: Insufficient documentation

## 2023-09-20 DIAGNOSIS — Z01812 Encounter for preprocedural laboratory examination: Secondary | ICD-10-CM | POA: Diagnosis not present

## 2023-09-20 LAB — CBC
HCT: 36.3 % (ref 36.0–46.0)
Hemoglobin: 12.2 g/dL (ref 12.0–15.0)
MCH: 29.2 pg (ref 26.0–34.0)
MCHC: 33.6 g/dL (ref 30.0–36.0)
MCV: 86.8 fL (ref 80.0–100.0)
Platelets: 228 10*3/uL (ref 150–400)
RBC: 4.18 MIL/uL (ref 3.87–5.11)
RDW: 14 % (ref 11.5–15.5)
WBC: 10.1 10*3/uL (ref 4.0–10.5)
nRBC: 0 % (ref 0.0–0.2)

## 2023-09-27 ENCOUNTER — Ambulatory Visit: Payer: BC Managed Care – PPO | Admitting: Certified Registered"

## 2023-09-27 ENCOUNTER — Encounter: Payer: Self-pay | Admitting: Obstetrics and Gynecology

## 2023-09-27 ENCOUNTER — Ambulatory Visit
Admission: RE | Admit: 2023-09-27 | Discharge: 2023-09-27 | Disposition: A | Discharge status: Home / Self Care | Payer: BC Managed Care – PPO | Source: Ambulatory Visit | Attending: Obstetrics and Gynecology | Admitting: Obstetrics and Gynecology

## 2023-09-27 ENCOUNTER — Encounter
Admission: RE | Disposition: A | Discharge status: Home / Self Care | Payer: Self-pay | Source: Ambulatory Visit | Attending: Obstetrics and Gynecology

## 2023-09-27 ENCOUNTER — Other Ambulatory Visit: Payer: Self-pay

## 2023-09-27 DIAGNOSIS — N938 Other specified abnormal uterine and vaginal bleeding: Secondary | ICD-10-CM | POA: Diagnosis not present

## 2023-09-27 DIAGNOSIS — F32A Depression, unspecified: Secondary | ICD-10-CM | POA: Insufficient documentation

## 2023-09-27 DIAGNOSIS — F17210 Nicotine dependence, cigarettes, uncomplicated: Secondary | ICD-10-CM | POA: Diagnosis not present

## 2023-09-27 DIAGNOSIS — R102 Pelvic and perineal pain: Secondary | ICD-10-CM | POA: Diagnosis not present

## 2023-09-27 DIAGNOSIS — F419 Anxiety disorder, unspecified: Secondary | ICD-10-CM | POA: Insufficient documentation

## 2023-09-27 DIAGNOSIS — Z113 Encounter for screening for infections with a predominantly sexual mode of transmission: Secondary | ICD-10-CM | POA: Diagnosis not present

## 2023-09-27 DIAGNOSIS — Z9889 Other specified postprocedural states: Secondary | ICD-10-CM

## 2023-09-27 DIAGNOSIS — N920 Excessive and frequent menstruation with regular cycle: Secondary | ICD-10-CM

## 2023-09-27 DIAGNOSIS — Z9851 Tubal ligation status: Secondary | ICD-10-CM | POA: Diagnosis not present

## 2023-09-27 DIAGNOSIS — Z01812 Encounter for preprocedural laboratory examination: Secondary | ICD-10-CM

## 2023-09-27 HISTORY — PX: DILITATION & CURRETTAGE/HYSTROSCOPY WITH NOVASURE ABLATION: SHX5568

## 2023-09-27 LAB — POCT PREGNANCY, URINE: Preg Test, Ur: NEGATIVE

## 2023-09-27 SURGERY — DILATATION & CURETTAGE/HYSTEROSCOPY WITH NOVASURE ABLATION
Anesthesia: General | Site: Uterus | Wound class: Clean Contaminated

## 2023-09-27 MED ORDER — CELECOXIB 200 MG PO CAPS
400.0000 mg | ORAL_CAPSULE | ORAL | Status: AC
  Administered 2023-09-27: 400 mg via ORAL

## 2023-09-27 MED ORDER — OXYCODONE HCL 5 MG PO TABS
5.0000 mg | ORAL_TABLET | Freq: Once | ORAL | Status: AC | PRN
  Administered 2023-09-27: 5 mg via ORAL

## 2023-09-27 MED ORDER — ORAL CARE MOUTH RINSE: 15.0000 mL | Freq: Once | OROMUCOSAL | Status: AC

## 2023-09-27 MED ORDER — DEXAMETHASONE SODIUM PHOSPHATE 10 MG/ML IJ SOLN
INTRAMUSCULAR | Status: AC
  Filled 2023-09-27: qty 1

## 2023-09-27 MED ORDER — OXYCODONE HCL 5 MG/5ML PO SOLN: 5.0000 mg | Freq: Once | ORAL | Status: AC | PRN

## 2023-09-27 MED ORDER — ONDANSETRON HCL 4 MG/2ML IJ SOLN
INTRAMUSCULAR | Status: AC
  Filled 2023-09-27: qty 2

## 2023-09-27 MED ORDER — CHLORHEXIDINE GLUCONATE 0.12 % MT SOLN
OROMUCOSAL | Status: AC
  Filled 2023-09-27: qty 15

## 2023-09-27 MED ORDER — PHENYLEPHRINE 80 MCG/ML (10ML) SYRINGE FOR IV PUSH (FOR BLOOD PRESSURE SUPPORT)
PREFILLED_SYRINGE | INTRAVENOUS | Status: DC | PRN
  Administered 2023-09-27: 80 ug via INTRAVENOUS

## 2023-09-27 MED ORDER — OXYCODONE HCL 5 MG PO TABS
ORAL_TABLET | ORAL | Status: AC
  Filled 2023-09-27: qty 1

## 2023-09-27 MED ORDER — ACETAMINOPHEN 500 MG PO TABS
ORAL_TABLET | ORAL | Status: AC
  Filled 2023-09-27: qty 2

## 2023-09-27 MED ORDER — IBUPROFEN 600 MG PO TABS: 600.0000 mg | ORAL_TABLET | Freq: Four times a day (QID) | ORAL | 0 refills | Status: AC | PRN

## 2023-09-27 MED ORDER — CHLORHEXIDINE GLUCONATE 0.12 % MT SOLN
15.0000 mL | Freq: Once | OROMUCOSAL | Status: AC
Start: 2023-09-27 — End: 2023-09-27
  Administered 2023-09-27: 15 mL via OROMUCOSAL

## 2023-09-27 MED ORDER — SILVER NITRATE-POT NITRATE 75-25 % EX MISC
CUTANEOUS | Status: AC
  Filled 2023-09-27: qty 10

## 2023-09-27 MED ORDER — CELECOXIB 200 MG PO CAPS
ORAL_CAPSULE | ORAL | Status: AC
  Filled 2023-09-27: qty 2

## 2023-09-27 MED ORDER — MIDAZOLAM HCL 2 MG/2ML IJ SOLN
INTRAMUSCULAR | Status: DC | PRN
  Administered 2023-09-27: 2 mg via INTRAVENOUS

## 2023-09-27 MED ORDER — 0.9 % SODIUM CHLORIDE (POUR BTL) OPTIME
TOPICAL | Status: DC | PRN
  Administered 2023-09-27: 500 mL

## 2023-09-27 MED ORDER — MIDAZOLAM HCL 2 MG/2ML IJ SOLN
INTRAMUSCULAR | Status: AC
Start: 2023-09-27 — End: ?
  Filled 2023-09-27: qty 2

## 2023-09-27 MED ORDER — ACETAMINOPHEN 10 MG/ML IV SOLN: 1000.0000 mg | Freq: Once | INTRAVENOUS | Status: DC | PRN

## 2023-09-27 MED ORDER — ONDANSETRON HCL 4 MG/2ML IJ SOLN
INTRAMUSCULAR | Status: DC | PRN
  Administered 2023-09-27: 4 mg via INTRAVENOUS

## 2023-09-27 MED ORDER — SODIUM CHLORIDE 0.9 % IR SOLN
Status: DC | PRN
Start: 2023-09-27 — End: 2023-09-27
  Administered 2023-09-27: 400 mL

## 2023-09-27 MED ORDER — ACETAMINOPHEN 500 MG PO TABS
1000.0000 mg | ORAL_TABLET | ORAL | Status: AC
  Administered 2023-09-27: 1000 mg via ORAL

## 2023-09-27 MED ORDER — LIDOCAINE HCL (CARDIAC) PF 100 MG/5ML IV SOSY
PREFILLED_SYRINGE | INTRAVENOUS | Status: DC | PRN
  Administered 2023-09-27: 100 mg via INTRAVENOUS

## 2023-09-27 MED ORDER — PROPOFOL 10 MG/ML IV BOLUS
INTRAVENOUS | Status: DC | PRN
  Administered 2023-09-27: 50 ug/kg/min via INTRAVENOUS
  Administered 2023-09-27: 150 mg via INTRAVENOUS

## 2023-09-27 MED ORDER — LACTATED RINGERS IV SOLN: INTRAVENOUS | Status: DC

## 2023-09-27 MED ORDER — GABAPENTIN 300 MG PO CAPS
300.0000 mg | ORAL_CAPSULE | ORAL | Status: AC
  Administered 2023-09-27: 300 mg via ORAL

## 2023-09-27 MED ORDER — FENTANYL CITRATE (PF) 100 MCG/2ML IJ SOLN
INTRAMUSCULAR | Status: DC | PRN
  Administered 2023-09-27: 25 ug via INTRAVENOUS
  Administered 2023-09-27: 50 ug via INTRAVENOUS
  Administered 2023-09-27: 25 ug via INTRAVENOUS

## 2023-09-27 MED ORDER — FENTANYL CITRATE (PF) 100 MCG/2ML IJ SOLN
25.0000 ug | INTRAMUSCULAR | Status: DC | PRN
Start: 2023-09-27 — End: 2023-09-27

## 2023-09-27 MED ORDER — GABAPENTIN 300 MG PO CAPS
ORAL_CAPSULE | ORAL | Status: AC
  Filled 2023-09-27: qty 1

## 2023-09-27 MED ORDER — DROPERIDOL 2.5 MG/ML IJ SOLN: 0.6250 mg | Freq: Once | INTRAMUSCULAR | Status: DC | PRN

## 2023-09-27 MED ORDER — DEXAMETHASONE SODIUM PHOSPHATE 10 MG/ML IJ SOLN
INTRAMUSCULAR | Status: DC | PRN
  Administered 2023-09-27: 10 mg via INTRAVENOUS

## 2023-09-27 MED ORDER — FENTANYL CITRATE (PF) 100 MCG/2ML IJ SOLN
INTRAMUSCULAR | Status: AC
  Filled 2023-09-27: qty 2

## 2023-09-27 MED ORDER — LIDOCAINE-EPINEPHRINE 1 %-1:100000 IJ SOLN
INTRAMUSCULAR | Status: AC
  Filled 2023-09-27: qty 1

## 2023-09-27 MED ORDER — PROPOFOL 10 MG/ML IV BOLUS
INTRAVENOUS | Status: AC
  Filled 2023-09-27: qty 40

## 2023-09-27 SURGICAL SUPPLY — 19 items
ABLATOR SURESOUND NOVASURE (ABLATOR) IMPLANT
DRSG TELFA 3X8 NADH STRL (GAUZE/BANDAGES/DRESSINGS) IMPLANT
GLOVE BIO SURGEON STRL SZ 6.5 (GLOVE) ×1 IMPLANT
GLOVE INDICATOR 7.0 STRL GRN (GLOVE) ×1 IMPLANT
GOWN STRL REUS W/ TWL LRG LVL3 (GOWN DISPOSABLE) ×2 IMPLANT
HANDPIECE ABLA MINERVA ENDO (MISCELLANEOUS) IMPLANT
IV LACTATED RINGERS 1000ML (IV SOLUTION) ×1 IMPLANT
KIT TURNOVER CYSTO (KITS) ×1 IMPLANT
LABEL OR SOLS (LABEL) ×1 IMPLANT
MANIFOLD NEPTUNE II (INSTRUMENTS) ×1 IMPLANT
NS IRRIG 500ML POUR BTL (IV SOLUTION) ×1 IMPLANT
PACK DNC HYST (MISCELLANEOUS) ×1 IMPLANT
PAD PREP OB/GYN DISP 24X41 (PERSONAL CARE ITEMS) ×1 IMPLANT
SCRUB CHG 4% DYNA-HEX 4OZ (MISCELLANEOUS) ×1 IMPLANT
SEAL ROD LENS SCOPE MYOSURE (ABLATOR) ×1 IMPLANT
SET CYSTO W/LG BORE CLAMP LF (SET/KITS/TRAYS/PACK) IMPLANT
TOWEL OR 17X26 4PK STRL BLUE (TOWEL DISPOSABLE) ×1 IMPLANT
TRAP FLUID SMOKE EVACUATOR (MISCELLANEOUS) ×1 IMPLANT
WATER STERILE IRR 500ML POUR (IV SOLUTION) ×1 IMPLANT

## 2023-09-27 NOTE — Anesthesia Preprocedure Evaluation (Addendum)
Anesthesia Evaluation  Patient identified by MRN, date of birth, ID band Patient awake    Reviewed: Allergy & Precautions, H&P , NPO status , Patient's Chart, lab work & pertinent test results  Airway Mallampati: III  TM Distance: >3 FB Neck ROM: full    Dental no notable dental hx.    Pulmonary Current Smoker and Patient abstained from smoking.   Pulmonary exam normal        Cardiovascular negative cardio ROS Normal cardiovascular exam     Neuro/Psych  PSYCHIATRIC DISORDERS Anxiety Depression    negative neurological ROS     GI/Hepatic Neg liver ROS,GERD  ,,  Endo/Other  negative endocrine ROS    Renal/GU      Musculoskeletal   Abdominal  (+) + obese  Peds  Hematology negative hematology ROS (+)   Anesthesia Other Findings Dysfunctional uterine bleeding  Past Medical History: No date: Anemia No date: Anxiety No date: Chronic lower back pain No date: Depression No date: GERD (gastroesophageal reflux disease)  Past Surgical History: 2011: CESAREAN SECTION 2015: CESAREAN SECTION 10/13/2017: CESAREAN SECTION WITH BILATERAL TUBAL LIGATION; Bilateral     Comment:  Procedure: CESAREAN SECTION WITH BILATERAL TUBAL               LIGATION;  Surgeon: Vena Austria, MD;  Location:               [facility] ORS;  Service: Obstetrics;  Laterality: Bilateral; 07/2010: DILATION AND CURETTAGE OF UTERUS 2003: INNER EAR SURGERY; Left 07/2021: LIPOSUCTION No date: TONSILLECTOMY  BMI    Body Mass Index: 31.34 kg/m      Reproductive/Obstetrics negative OB ROS                              Anesthesia Physical Anesthesia Plan  ASA: 2  Anesthesia Plan: General LMA   Post-op Pain Management: Tylenol PO (pre-op)*, Celebrex PO (pre-op)* and Gabapentin PO (pre-op)*   Induction:   PONV Risk Score and Plan: 3 and Dexamethasone, Ondansetron, Midazolam and Treatment may vary due to age or medical  condition  Airway Management Planned: LMA  Additional Equipment:   Intra-op Plan:   Post-operative Plan: Extubation in OR  Informed Consent: I have reviewed the patients History and Physical, chart, labs and discussed the procedure including the risks, benefits and alternatives for the proposed anesthesia with the patient or authorized representative who has indicated his/her understanding and acceptance.     Dental Advisory Given  Plan Discussed with: Anesthesiologist, CRNA and Surgeon  Anesthesia Plan Comments:          Anesthesia Quick Evaluation

## 2023-09-27 NOTE — Anesthesia Procedure Notes (Addendum)
Procedure Name: LMA Insertion Date/Time: 09/27/2023 10:52 AM  Performed by: Elisabeth Pigeon, CRNAPre-anesthesia Checklist: Patient identified, Emergency Drugs available, Suction available and Patient being monitored Patient Re-evaluated:Patient Re-evaluated prior to induction Oxygen Delivery Method: Circle system utilized Preoxygenation: Pre-oxygenation with 100% oxygen Induction Type: IV induction Ventilation: Mask ventilation without difficulty LMA: LMA flexible inserted LMA Size: 4.0 Tube type: Oral Number of attempts: 1 Airway Equipment and Method: Oral airway Placement Confirmation: ETT inserted through vocal cords under direct vision, positive ETCO2 and breath sounds checked- equal and bilateral Tube secured with: Tape Dental Injury: Teeth and Oropharynx as per pre-operative assessment

## 2023-09-27 NOTE — Op Note (Addendum)
Procedure(s): HYSTEROSCOPY D&C WITH ENDOMETRIAL ABLATION (NOVASURE) Procedure Note  Denise Houston female 37 y.o. 09/27/2023  Indications: The patient is a 37 y.o. [id number] female with dysfunctional uterine bleeding and pelvic pain  Pre-operative Diagnosis: Dysfunctional uterine bleeding and pelvic pain. History of tubal ligation  Post-operative Diagnosis: Same  Surgeon: Hildred Laser, MD  Assistants:  None.   Anesthesia: General endotracheal anesthesia  Findings: Uterus sounded to 9.5 cm.  Cervical length 3.5 cm.  Weakly proliferative endometrium.  Tubal ostia were visualized bilaterally.  No intrauterine masses.  Total ablation time 55 secs.   Adequate charring of endometrial tissue post ablation.  No uterine perforations noted.   Procedure Details: The patient was seen in the Holding Room. The risks, benefits, complications, treatment options, and expected outcomes were discussed with the patient.  The patient concurred with the proposed plan, giving informed consent.  The site of surgery properly noted/marked. The patient was taken to the Operating Room, identified as Denise Houston and the procedure verified as Procedure(s) (LRB): HYSTEROSCOPY D&C WITH ENDOMETRIAL ABLATION (NOVASURE) (N/A). A Time Out was held and the above information confirmed.  She was then placed under general anesthesia without difficulty. She was placed in the dorsal lithotomy position, and was prepped and draped in a sterile manner.  A straight catheterization was performed. A sterile speculum was inserted into the vagina and the cervix was grasped at the anterior lip using a single-toothed tenaculum.  The uterus was sounded to 9.5 cm and the cervical length was determined to be 6 cm. Cervical dilation was then performed. A 5 mm hysteroscope was introduced into the uterus under direct visualization. The cavity was allowed to fill, and then the entire cavity was explored with the findings described above. The  hysteroscope was removed. Next the NovaSure instrument was primed per instructions. The instrument was then placed into the endometrial canal and activated.  Total ablation time was 55 sec.  The NovaSure instrument was then removed from the uterine cavity.  The hysteroscope was then re-introduced for final survey, with adequate charring of the endometrium noted.  The hysteroscope was removed from the patient's uterine cavity. The tenaculum was removed and excellent hemostasis was noted. The speculum was removed from the vagina.   All instrument and sponge counts were correct at the end of the procedure x 2.  The patient tolerated the procedure well.  She was awakened and taken to the PACU in stable condition.    Estimated Blood Loss:  minimal      Drains: straight catheterization with 30 cc at start of procedure.          Total IV Fluids:  200 ml  Specimens: None         Implants: None         Complications:  None; patient tolerated the procedure well.         Disposition: PACU - hemodynamically stable.         Condition: stable    Hildred Laser, MD [facility]

## 2023-09-27 NOTE — Transfer of Care (Signed)
Immediate Anesthesia Transfer of Care Note  Patient: Christen Bame  Procedure(s) Performed: HYSTEROSCOPY D&C WITH ENDOMETRIAL ABLATION (NOVASURE)  Patient Location: PACU  Anesthesia Type:General  Level of Consciousness: sedated  Airway & Oxygen Therapy: Patient Spontanous Breathing and Patient connected to face mask oxygen  Post-op Assessment: Report given to RN and Post -op Vital signs reviewed and stable  Post vital signs: Reviewed and stable  Last Vitals:  Vitals Value Taken Time  BP 142/98 09/27/23 1135  Temp    Pulse 91 09/27/23 1137  Resp 18 09/27/23 1137  SpO2 100 % 09/27/23 1137  Vitals shown include unfiled device data.  Last Pain:  Vitals:   09/27/23 0852  TempSrc: Oral  PainSc: 0-No pain         Complications: No notable events documented.

## 2023-09-27 NOTE — H&P (Signed)
GYNECOLOGY PREOPERATIVE HISTORY AND PHYSICAL   Subjective:  Denise Houston is a 37 y.o. [id number] here for preoperative exam for surgical management of dysfunctional uterine bleeding.  Recently had IUD removed due to continued bleeding and pelvic pain (kept in place for ~ 4 months).  Now currently on continuous OCPs but does not desire to use long-term.  Also presents today for final workup prior to procedure (endometrial biopsy).   Patient also desires to be screened for STDs. Notes that her partner had concerns about possible exposure and symptoms, however patient denies exhibiting any symptoms or history of STIs. Desires to be screened today.  Declines serology testing.   Proposed surgery: Hysteroscopy D&C with endometrial ablation    Pertinent Gynecological History: Menses:  unsure LMP due to continuous OCP use.  Bleeding: intermenstrual bleeding Contraception:  continuous OCPs and tubal ligation Last pap: normal Date: 12/29/2022   Past Medical History:  Diagnosis Date   Anemia    Anxiety    Chronic lower back pain    Depression    GERD (gastroesophageal reflux disease)     Past Surgical History:  Procedure Laterality Date   CESAREAN SECTION  2011   CESAREAN SECTION  2015   CESAREAN SECTION WITH BILATERAL TUBAL LIGATION Bilateral 10/13/2017   Procedure: CESAREAN SECTION WITH BILATERAL TUBAL LIGATION;  Surgeon: Vena Austria, MD;  Location: [facility];  Service: Obstetrics;  Laterality: Bilateral;   DILATION AND CURETTAGE OF UTERUS  07/2010   INNER EAR SURGERY Left 2003   LIPOSUCTION  07/2021   TONSILLECTOMY      OB History  Gravida Para Term Preterm AB Living  5 3 3   2 3   SAB IAB Ectopic Multiple Live Births  2     0 3    # Outcome Date GA Lbr Len/2nd Weight Sex Type Anes PTL Lv  5 Term 10/13/17 [redacted]w[redacted]d  3140 g M CS-LTranv Spinal  LIV  4 Term 01/04/14   4054 g  CS-LTranv   LIV  3 Term 11/09/09   3147 g F CS-LTranv   LIV  2 SAB           1 SAB              Family History  Problem Relation Age of Onset   Thyroid disease Mother    Other Father        unknown medical history    Social History   Socioeconomic History   Marital status: Single    Spouse name: Not on file   Number of children: 3   Years of education: Not on file   Highest education level: Not on file  Occupational History   Not on file  Tobacco Use   Smoking status: Former    Current packs/day: 0.00    Types: Cigarettes    Quit date: 09/12/2017    Years since quitting: 6.0   Smokeless tobacco: Never  Vaping Use   Vaping status: Every Day   Substances: Nicotine, Flavoring  Substance and Sexual Activity   Alcohol use: Yes    Comment: rarely   Drug use: No   Sexual activity: Yes    Birth control/protection: Pill, Surgical    Comment: Tubal Ligation  Other Topics Concern   Not on file  Social History Narrative   Not on file   Social Determinants of Health   Financial Resource Strain: Not on file  Food Insecurity: Not on file  Transportation Needs: Not on file  Physical Activity: Not on file  Stress: Not on file  Social Connections: Not on file  Intimate Partner Violence: Not on file   No current facility-administered medications on file prior to encounter.   Current Outpatient Medications on File Prior to Encounter  Medication Sig Dispense Refill   drospirenone-ethinyl estradiol (NIKKI) 3-0.02 MG tablet Take 1 tablet by mouth daily.     escitalopram (LEXAPRO) 20 MG tablet Take 10 mg by mouth daily.     No Known Allergies    Review of Systems Constitutional: No recent fever/chills/sweats Respiratory: No recent cough/bronchitis Cardiovascular: No chest pain Gastrointestinal: No recent nausea/vomiting/diarrhea Genitourinary: No UTI symptoms Hematologic/lymphatic:No history of coagulopathy or recent blood thinner use    Objective:   Blood pressure (!) 127/95, pulse 72, temperature 98.3 F (36.8 C), temperature source Oral, resp. rate 16,  height 5\' 7"  (1.702 m), weight 90.8 kg, last menstrual period 09/11/2023, SpO2 96%. CONSTITUTIONAL: Well-developed, well-nourished female in no acute distress.  HENT:  Normocephalic, atraumatic, External right and left ear normal. Oropharynx is clear and moist EYES: Conjunctivae and EOM are normal. Pupils are equal, round, and reactive to light. No scleral icterus.  NECK: Normal range of motion, supple, no masses SKIN: Skin is warm and dry. No rash noted. Not diaphoretic. No erythema. No pallor. NEUROLOGIC: Alert and oriented to person, place, and time. Normal reflexes, muscle tone coordination. No cranial nerve deficit noted. PSYCHIATRIC: Normal mood and affect. Normal behavior. Normal judgment and thought content. CARDIOVASCULAR: Normal heart rate noted, regular rhythm RESPIRATORY: Effort and breath sounds normal, no problems with respiration noted ABDOMEN: Soft, nontender, nondistended. PELVIC: Deferred MUSCULOSKELETAL: Normal range of motion. No edema and no tenderness. 2+ distal pulses.    Labs: Results for orders placed or performed during the hospital encounter of 09/27/23 (from the past 336 hour(s))  Pregnancy, urine POC   Collection Time: 09/27/23  8:47 AM  Result Value Ref Range   Preg Test, Ur NEGATIVE NEGATIVE  Results for orders placed or performed during the hospital encounter of 09/20/23 (from the past 336 hour(s))  CBC   Collection Time: 09/20/23 10:06 AM  Result Value Ref Range   WBC 10.1 4.0 - 10.5 K/uL   RBC 4.18 3.87 - 5.11 MIL/uL   Hemoglobin 12.2 12.0 - 15.0 g/dL   HCT 08.6 57.8 - 46.9 %   MCV 86.8 80.0 - 100.0 fL   MCH 29.2 26.0 - 34.0 pg   MCHC 33.6 30.0 - 36.0 g/dL   RDW 62.9 52.8 - 41.3 %   Platelets 228 150 - 400 K/uL   nRBC 0.0 0.0 - 0.2 %    Pathology:  A. ENDOMETRIUM, BIOPSY:  -  Attenuated endometrial epithelium in the background of stromal  spindling and focal evidence of breakdown.  -  A immunohistochemical stain for MUM1 highlights the rare  equivocal  plasma cell of uncertain clinical significance.    Imaging Studies: Patient Name: ZARYN WHEATCRAFT DOB: 1985-12-18 MRN: [id number] LMP:    ULTRASOUND REPORT   Location: [facility] Date of Service: 09/06/2023    Indications: dysfunctional uterine bleeding Findings:  The uterus is anteverted and measures 9.4 x 5.7 x 4.3 cm with a uterine volume of 123.4. Echo texture is heterogenous without evidence of focal masses. Cesarean section scar is prominent   The Endometrium measures 5.7 mm.   Right Ovary measures 1.8 x 1.6 x 1.0 cm. It is normal in appearance. Left Ovary measures 2.5x 2.7 x 1.4 cm. It is normal  in appearance. Survey of the adnexa demonstrates no adnexal masses. There is no free fluid in the cul de sac.   Impression: 1. unremarkable   Recommendations: 1.Clinical correlation with the patient's History and Physical Exam.     Katheran Awe    Assessment:   Dysfunctional uterine bleeding STD screening   Plan:   Counseling: Procedure, risks, reasons, benefits and complications (including injury to bowel, bladder, major blood vessel, ureter, bleeding, possibility of transfusion, infection, or fistula formation) reviewed in detail. Also discussed the risk of post-ablation syndrome due to history of tubal ligation. Likelihood of success in alleviating the patient's condition was discussed. Routine postoperative instructions will be reviewed with the patient and her family in detail after surgery.  The patient concurred with the proposed plan, giving informed written consent for the surgery.   Preop testing ordered. STD screening performed at patient's request.  Instructions reviewed, including NPO after midnight.       Hildred Laser, MD [facility]

## 2023-09-28 ENCOUNTER — Encounter: Payer: Self-pay | Admitting: Obstetrics and Gynecology

## 2023-09-28 NOTE — Anesthesia Postprocedure Evaluation (Signed)
Anesthesia Post Note  Patient: Christen Bame  Procedure(s) Performed: HYSTEROSCOPY D&C WITH ENDOMETRIAL ABLATION (NOVASURE) (Uterus)  Patient location during evaluation: PACU Anesthesia Type: General Level of consciousness: awake and alert Pain management: pain level controlled Vital Signs Assessment: post-procedure vital signs reviewed and stable Respiratory status: spontaneous breathing, nonlabored ventilation and respiratory function stable Cardiovascular status: blood pressure returned to baseline and stable Postop Assessment: no apparent nausea or vomiting Anesthetic complications: no   No notable events documented.   Last Vitals:  Vitals:   09/27/23 1215 09/27/23 1227  BP:  124/83  Pulse: 80 86  Resp:  18  Temp: 37.1 C 36.9 C  SpO2:  99%    Last Pain:  Vitals:   09/27/23 1227  TempSrc: Temporal  PainSc: 4                  Foye Deer

## 2023-10-05 NOTE — Progress Notes (Unsigned)
    OBSTETRICS/GYNECOLOGY POST-OPERATIVE CLINIC VISIT  Subjective:     Denise Houston is a 37 y.o. female who presents to the clinic 1 weeks status post HYSTEROSCOPY D&C WITH ENDOMETRIAL ABLATION (NOVASURE  for Dysfunctional uterine bleeding . Eating a regular diet {with-without:5700} difficulty. Bowel movements are {normal/abnormal***:19619}. {pain control:13522::"The patient is not having any pain."}  {Common ambulatory SmartLinks:19316}  Review of Systems {ros; complete:30496}   Objective:   LMP 09/11/2023 (Approximate)  There is no height or weight on file to calculate BMI.  General:  alert and no distress  Abdomen: soft, bowel sounds active, non-tender  Incision:   {incision:13716::"no dehiscence","incision well approximated","healing well","no drainage","no erythema","no hernia","no seroma","no swelling"}    Pathology:  Surgical Pathology Report   Clinical History: Dysfunctional uterine bleeding (nt)   FINAL MICROSCOPIC DIAGNOSIS:   A. ENDOMETRIUM, BIOPSY:  -  Attenuated endometrial epithelium in the background of stromal  spindling and focal evidence of breakdown.  -  A immunohistochemical stain for MUM1 highlights the rare equivocal  plasma cell of uncertain clinical significance.   GROSS DESCRIPTION:   Specimen is received in formalin and consists of a 3.0 x 2.1 x 1.0 cm  aggregate of tan-brown soft tissue, clotted blood and mucus.  Specimen  is entirely submitted in 2 cassettes.   Assessment:   Patient s/p HYSTEROSCOPY D&C WITH ENDOMETRIAL ABLATION (NOVASURE  (surgery)  {doing well:13525::"Doing well postoperatively."}   Plan:   1. Continue any current medications as instructed by provider. 2. Wound care discussed. 3. Operative findings again reviewed. Pathology report discussed. 4. Activity restrictions: {restrictions:13723} 5. Anticipated return to work: {work return:14002}. 6. Follow up: {8-41:32440} {time; units:18646} for ***      Hildred Laser, MD  [facility]

## 2023-10-06 ENCOUNTER — Ambulatory Visit: Payer: BC Managed Care – PPO | Admitting: Obstetrics and Gynecology

## 2023-10-14 ENCOUNTER — Encounter: Payer: Self-pay | Admitting: Obstetrics and Gynecology

## 2023-11-23 ENCOUNTER — Other Ambulatory Visit: Payer: Self-pay | Admitting: Family Medicine

## 2023-11-23 NOTE — Telephone Encounter (Signed)
Copied from CRM [id number]. Topic: Clinical - Medication Refill >> Nov 23, 2023  8:34 AM Denise Houston wrote: Most Recent Primary Care Visit:   Medication: escitalopram (LEXAPRO) 20 MG tablet  Has the patient contacted their pharmacy? No  Is this the correct pharmacy for this prescription? Yes  This is the patient's preferred pharmacy:  [facility] - [place], [place] - [address] [place] [address] Phone: [phone] Fax: [phone]   Has the prescription been filled recently? Yes  Is the patient out of the medication? Yes  Has the patient been seen for an appointment in the last year OR does the patient have an upcoming appointment? Yes  Can we respond through MyChart? Yes  Agent: Please be advised that Rx refills may take up to 3 business days. We ask that you follow-up with your pharmacy.  Patient scheduled her new patient appt but has ran out of medication and need enough to get her to her first appt

## 2023-11-23 NOTE — Telephone Encounter (Signed)
Patient does not have a PCP. Will need an appt/establish care.      Copied from CRM [id number]. Topic: Clinical - Medication Refill >> Nov 23, 2023  8:34 AM Denise Houston wrote: Most Recent Primary Care Visit:   Medication: escitalopram (LEXAPRO) 20 MG tablet  Has the patient contacted their pharmacy? No  Is this the correct pharmacy for this prescription? Yes  This is the patient's preferred pharmacy:  [facility] - [place], [place] - [address] [place] [address] Phone: [phone] Fax: [phone]   Has the prescription been filled recently? Yes  Is the patient out of the medication? Yes  Has the patient been seen for an appointment in the last year OR does the patient have an upcoming appointment? Yes  Can we respond through MyChart? Yes  Agent: Please be advised that Rx refills may take up to 3 business days. We ask that you follow-up with your pharmacy.  Patient scheduled her new patient appt but has ran out of medication and need enough to get her to her first appt

## 2023-12-02 ENCOUNTER — Encounter: Payer: Self-pay | Admitting: Family Medicine

## 2023-12-02 ENCOUNTER — Ambulatory Visit: Payer: BC Managed Care – PPO | Admitting: Family Medicine

## 2023-12-02 VITALS — BP 125/87 | HR 84 | Temp 97.6°F | Resp 14 | Ht 67.0 in | Wt 189.6 lb

## 2023-12-02 DIAGNOSIS — F324 Major depressive disorder, single episode, in partial remission: Secondary | ICD-10-CM | POA: Diagnosis not present

## 2023-12-02 MED ORDER — ESCITALOPRAM OXALATE 20 MG PO TABS: 20.0000 mg | ORAL_TABLET | Freq: Every day | ORAL | 5 refills | Status: DC

## 2023-12-02 NOTE — Progress Notes (Signed)
   Established Patient Office Visit  Subjective   Patient ID: Denise Houston, female    DOB: Dec 20, 1985  Age: 38 y.o. MRN: [id number]  Chief Complaint  Patient presents with   Establish Care   Medical Management of Chronic Issues    HPI Ruberta ran out of of Lexapro  about 3 weeks ago.  She has noticed low motivation, some depressed mood and an increase in sleeping.  She is also noted she is not eating as much.  She thinks she lost about 20 pounds.  PHQ-9 is 6 GAD-7 is 4 She recently had a NovaSure and s/p endometrial ablation by Dr. Connell.  She is doing really well.  Has no pain and her menses is very light now.    ROS    Objective:     BP 125/87 (BP Location: Right Arm, Patient Position: Sitting, Cuff Size: Normal)   Pulse 84   Temp 97.6 F (36.4 C) (Oral)   Resp 14   Ht 5' 7 (1.702 m) Comment: per patient  Wt 189 lb 9.6 oz (86 kg)   SpO2 98%   BMI 29.70 kg/m    Physical Exam Vitals and nursing note reviewed.  Constitutional:      Appearance: Normal appearance.  HENT:     Head: Normocephalic and atraumatic.  Eyes:     Conjunctiva/sclera: Conjunctivae normal.  Cardiovascular:     Rate and Rhythm: Normal rate and regular rhythm.  Pulmonary:     Effort: Pulmonary effort is normal.     Breath sounds: Normal breath sounds.  Musculoskeletal:     Right lower leg: No edema.     Left lower leg: No edema.  Skin:    General: Skin is warm and dry.  Neurological:     Mental Status: She is alert and oriented to person, place, and time.  Psychiatric:        Mood and Affect: Mood normal.        Behavior: Behavior normal.        Thought Content: Thought content normal.        Judgment: Judgment normal.          No results found for any visits on 12/02/23.    The ASCVD Risk score (Arnett DK, et al., 2019) failed to calculate for the following reasons:   The 2019 ASCVD risk score is only valid for ages 61 to 45    Assessment & Plan:  Depression, major, single  episode, in partial remission (HCC) -     Escitalopram  Oxalate; Take 1 tablet (20 mg total) by mouth daily.  Dispense: 30 tablet; Refill: 5     No follow-ups on file.    Jazmarie Biever K Jan Olano, MD

## 2024-01-25 ENCOUNTER — Ambulatory Visit: Payer: BC Managed Care – PPO | Admitting: Nurse Practitioner

## 2024-06-30 ENCOUNTER — Ambulatory Visit: Admitting: Family Medicine

## 2024-06-30 ENCOUNTER — Encounter: Payer: Self-pay | Admitting: Family Medicine

## 2024-06-30 VITALS — BP 120/85 | HR 97 | Temp 97.8°F | Resp 18 | Ht 67.0 in | Wt 179.0 lb

## 2024-06-30 DIAGNOSIS — F324 Major depressive disorder, single episode, in partial remission: Secondary | ICD-10-CM

## 2024-06-30 DIAGNOSIS — W57XXXA Bitten or stung by nonvenomous insect and other nonvenomous arthropods, initial encounter: Secondary | ICD-10-CM

## 2024-06-30 MED ORDER — DOXYCYCLINE HYCLATE 100 MG PO CAPS: 100.0000 mg | ORAL_CAPSULE | Freq: Two times a day (BID) | ORAL | 0 refills | Status: AC

## 2024-06-30 MED ORDER — CLOBETASOL PROPIONATE 0.05 % EX CREA: 1.0000 | TOPICAL_CREAM | Freq: Two times a day (BID) | CUTANEOUS | 0 refills | Status: AC

## 2024-06-30 MED ORDER — ESCITALOPRAM OXALATE 20 MG PO TABS: 20.0000 mg | ORAL_TABLET | Freq: Every day | ORAL | 1 refills | Status: AC

## 2024-06-30 NOTE — Progress Notes (Unsigned)
   Established Patient Office Visit  Subjective   Patient ID: Denise Houston, female    DOB: [birth year]  Age: 38 y.o. MRN: [id number]  Chief Complaint  Patient presents with   Medical Management of Chronic Issues    HPI 38 yo with depression, S/p Novasure, s/p endometrial ablation       Objective:     BP 120/85 (BP Location: Left Arm, Patient Position: Sitting, Cuff Size: Normal)   Pulse 97   Temp 97.8 F (36.6 C) (Oral)   Resp 18   Ht 5' 7 (1.702 m)   Wt 179 lb (81.2 kg)   SpO2 98%   BMI 28.04 kg/m  {Vitals History (Optional):23777}  Physical Exam Vitals and nursing note reviewed.  Constitutional:      Appearance: Normal appearance.  HENT:     Head: Normocephalic and atraumatic.  Eyes:     Conjunctiva/sclera: Conjunctivae normal.  Cardiovascular:     Rate and Rhythm: Normal rate and regular rhythm.  Pulmonary:     Effort: Pulmonary effort is normal.     Breath sounds: Normal breath sounds.  Musculoskeletal:     Right lower leg: No edema.     Left lower leg: No edema.  Skin:    General: Skin is warm and dry.  Neurological:     Mental Status: She is alert and oriented to person, place, and time.  Psychiatric:        Mood and Affect: Mood normal.        Behavior: Behavior normal.        Thought Content: Thought content normal.        Judgment: Judgment normal.    ***  {Perform Simple Foot Exam  Perform Detailed exam:1} {Insert foot Exam (Optional):30965}   No results found for any visits on 06/30/24.  {Labs (Optional):23779}  The ASCVD Risk score (Arnett DK, et al., 2019) failed to calculate for the following reasons:   Cannot find a previous HDL lab   Cannot find a previous total cholesterol lab    Assessment & Plan:  Bug bite, initial encounter -     Doxycycline  Hyclate; Take 1 capsule (100 mg total) by mouth 2 (two) times daily.  Dispense: 14 capsule; Refill: 0 -     Clobetasol  Propionate; Apply 1 Application topically 2 (two) times daily.   Dispense: 30 g; Refill: 0  Depression, major, single episode, in partial remission (HCC) -     Escitalopram  Oxalate; Take 1 tablet (20 mg total) by mouth daily.  Dispense: 90 tablet; Refill: 1     No follow-ups on file.    Annalea Alguire K Rueben Kassim, MD

## 2024-07-10 ENCOUNTER — Encounter: Payer: Self-pay | Admitting: Podiatry

## 2024-07-10 ENCOUNTER — Ambulatory Visit

## 2024-07-10 ENCOUNTER — Ambulatory Visit (INDEPENDENT_AMBULATORY_CARE_PROVIDER_SITE_OTHER): Admitting: Podiatry

## 2024-07-10 DIAGNOSIS — S91209A Unspecified open wound of unspecified toe(s) with damage to nail, initial encounter: Secondary | ICD-10-CM

## 2024-07-10 NOTE — Progress Notes (Signed)
  Subjective:  Patient ID: Denise Houston, female    DOB: 23-Aug-1986,   MRN: [id number]  Chief Complaint  Patient presents with   Nail Problem    I ran out the door this morning.  The door hit my toe and ripped my toenail off. (Hallux right)    38 y.o. female presents for concern of injury to the right great toenail this morning. Relates she pulled to door opened and hit her toenail and lifted the nail. IT is still attached and very painful for her  . Denies any other pedal complaints. Denies n/v/f/c.   Past Medical History:  Diagnosis Date   Anemia    Anxiety    Chronic lower back pain    Depression    GERD (gastroesophageal reflux disease)     Objective:  Physical Exam: Vascular: DP/PT pulses 2/4 bilateral. CFT <3 seconds. Normal hair growth on digits. No edema.  Skin. No lacerations or abrasions bilateral feet. Right hallux nail lifted mostly holding on proximal medial border. Tender to palaption.  Musculoskeletal: MMT 5/5 bilateral lower extremities in DF, PF, Inversion and Eversion. Deceased ROM in DF of ankle joint.  Neurological: Sensation intact to light touch.   Assessment:   1. Traumatic avulsion of nail plate of toe, initial encounter      Plan:  Patient was evaluated and treated and all questions answered. Discussed ingrown toenails etiology and treatment options including procedure for removal vs conservative care.  Patient requesting removal of ingrown nail today. Procedure below.  Discussed procedure and post procedure care and patient expressed understanding.  Will follow-up in 2 weeks for nail check or sooner if any problems arise.    Procedure:  Procedure: total Nail Avulsion of right hallux nail Surgeon: Asberry Failing, DPM  Pre-op Dx: Ingrown toenail without infection Post-op: Same  Place of Surgery: Office exam room.  Indications for surgery: Painful and ingrown toenail.    The patient is requesting removal of nail without  chemical  matrixectomy. Risks and complications were discussed with the patient for which they understand and written consent was obtained. Under sterile conditions a total of 3 mL of  1% lidocaine  plain was infiltrated in a hallux block fashion. Once anesthetized, the skin was prepped in sterile fashion. A tourniquet was then applied. Next the entire nail was removed and area copiously irrigated. Silvadene was applied. A dry sterile dressing was applied. After application of the dressing the tourniquet was removed and there is found to be an immediate capillary refill time to the digit. The patient tolerated the procedure well without any complications. Post procedure instructions were discussed the patient for which he verbally understood. Follow-up in two weeks for nail check or sooner if any problems are to arise. Discussed signs/symptoms of infection and directed to call the office immediately should any occur or go directly to the emergency room. In the meantime, encouraged to call the office with any questions, concerns, changes symptoms.   Asberry Failing, DPM   e

## 2024-07-10 NOTE — Patient Instructions (Signed)
